# Patient Record
Sex: Male | Born: 1960
Health system: Southern US, Community
[De-identification: ages and names within clinical notes are randomized; demographics above are authoritative.]

## PROBLEM LIST (undated history)

## (undated) DIAGNOSIS — I10 Essential (primary) hypertension: Secondary | ICD-10-CM

## (undated) DIAGNOSIS — F419 Anxiety disorder, unspecified: Secondary | ICD-10-CM

## (undated) DIAGNOSIS — K829 Disease of gallbladder, unspecified: Secondary | ICD-10-CM

## (undated) HISTORY — DX: Essential (primary) hypertension: I10

## (undated) HISTORY — DX: Anxiety disorder, unspecified: F41.9

## (undated) HISTORY — DX: Disease of gallbladder, unspecified: K82.9

## (undated) HISTORY — PX: HERNIA REPAIR: SHX51

---

## 2005-12-17 ENCOUNTER — Emergency Department: Payer: Self-pay | Admitting: Emergency Medicine

## 2008-01-16 IMAGING — US ABDOMEN ULTRASOUND
1 series · 17 of 25 positions shown · non-contrast
Comparison: none

REASON FOR EXAM: Gallstones. Rm 12
COMMENTS:

[Series 1: abdomen ultrasound · 17 of 87 slices shown]
[im 1/87]
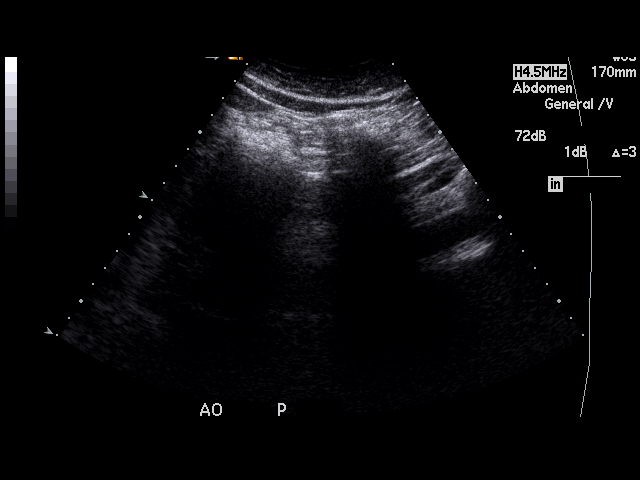
[im 8/87]
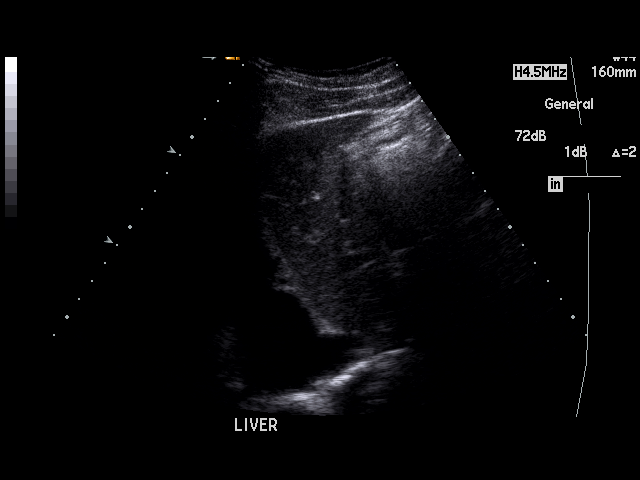
[im 11/87]
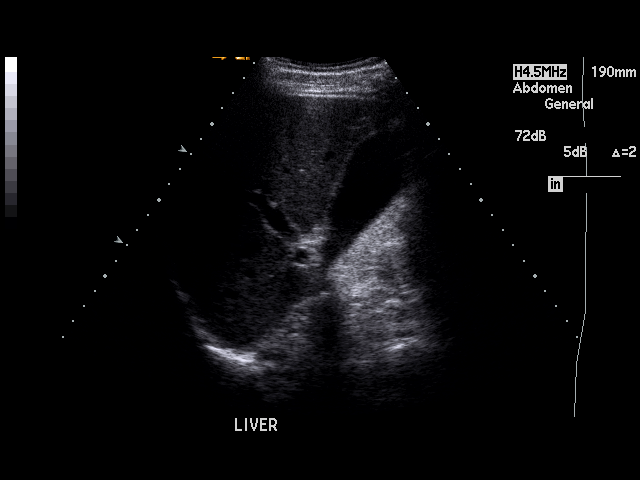
[im 18/87]
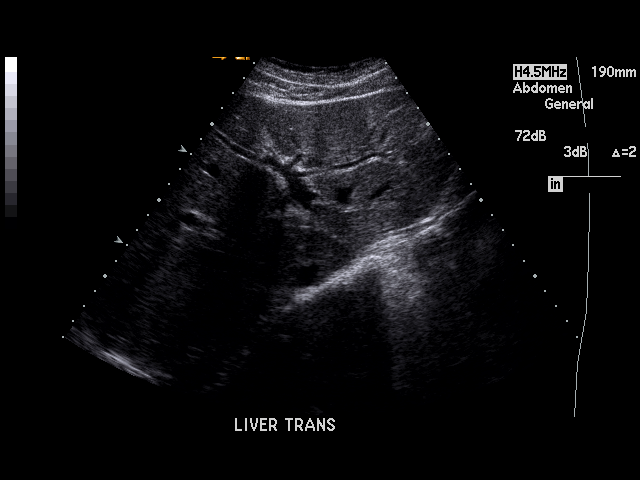
[im 22/87]
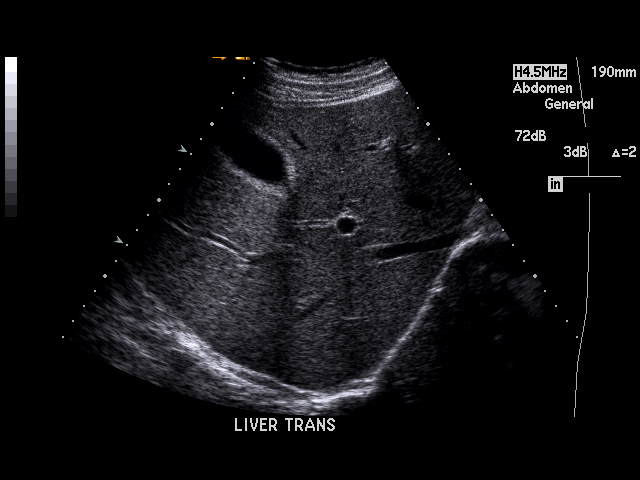
[im 29/87]
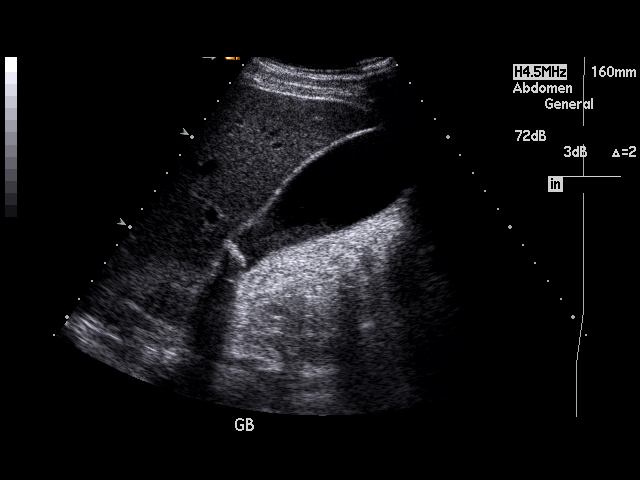
[im 33/87]
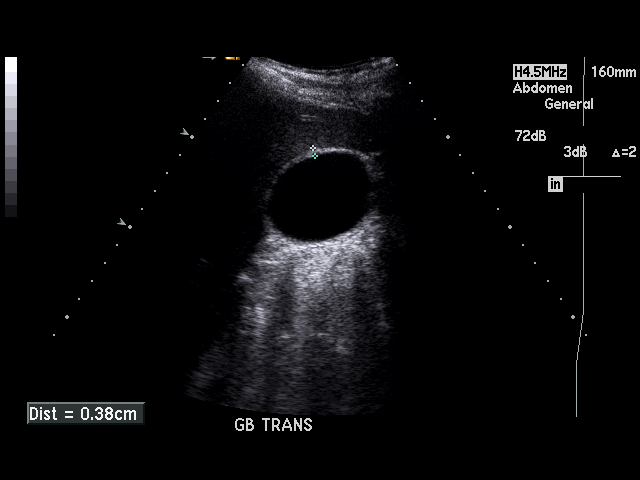
[im 40/87]
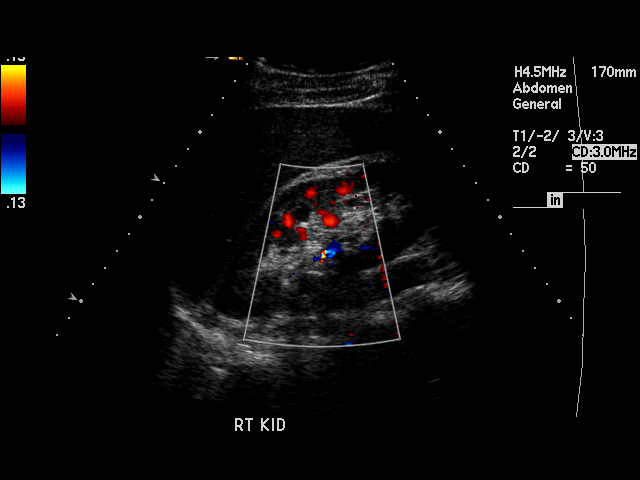
[im 44/87]
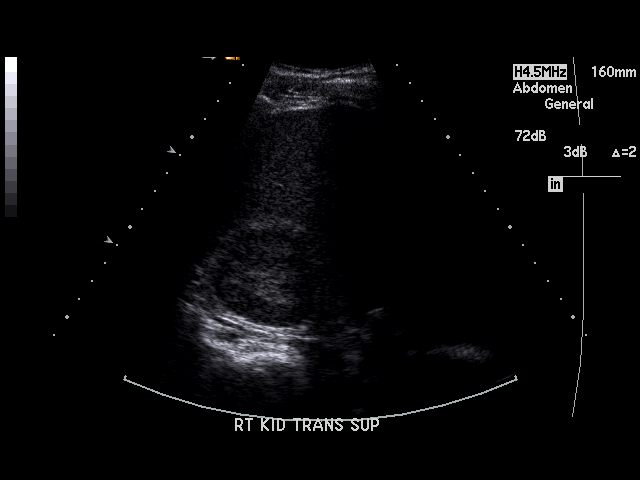
[im 47/87]
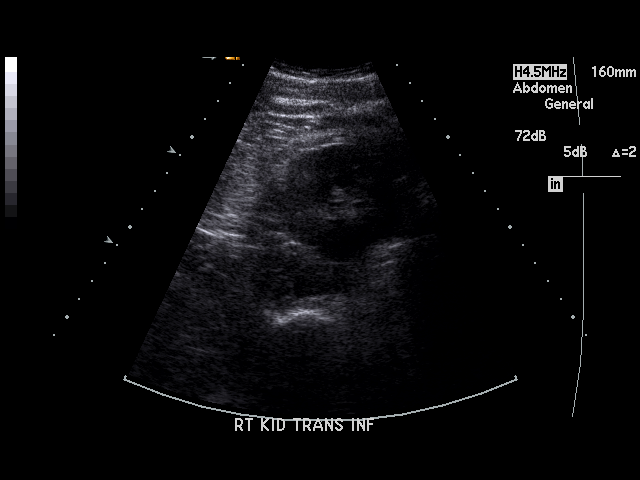
[im 54/87]
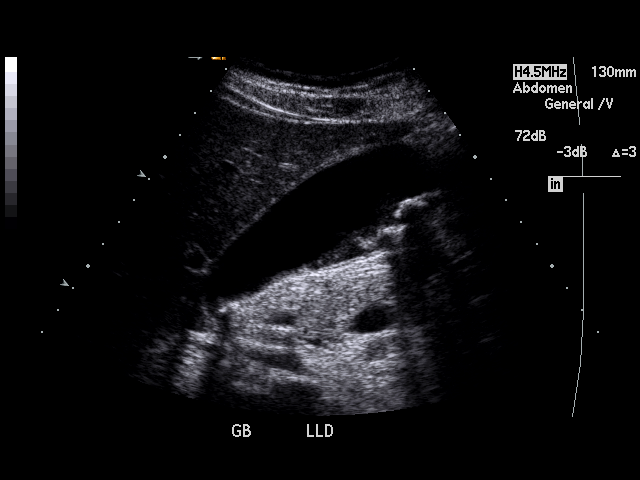
[im 58/87]
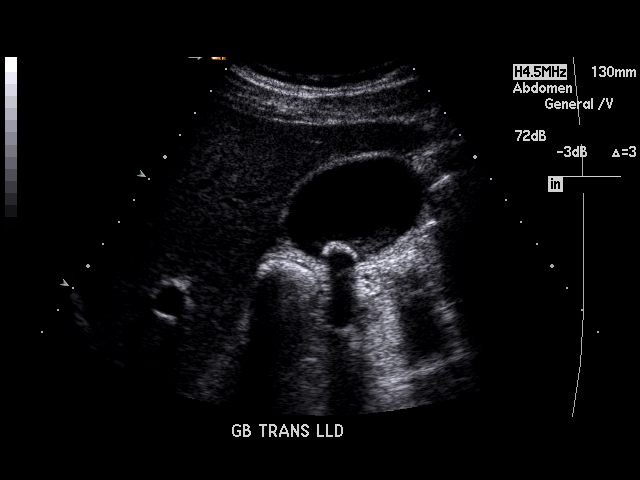
[im 65/87]
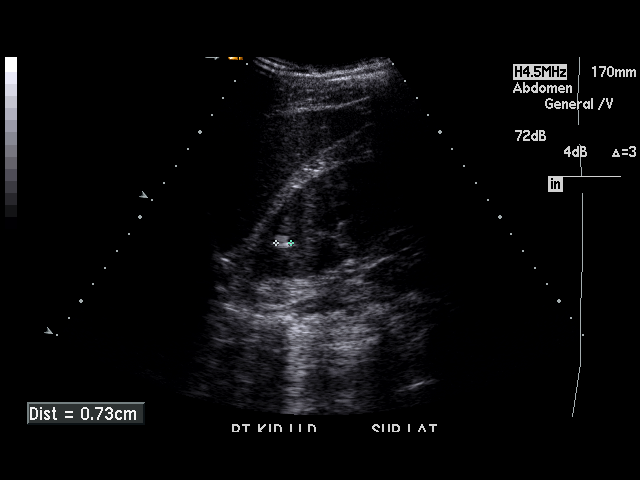
[im 69/87]
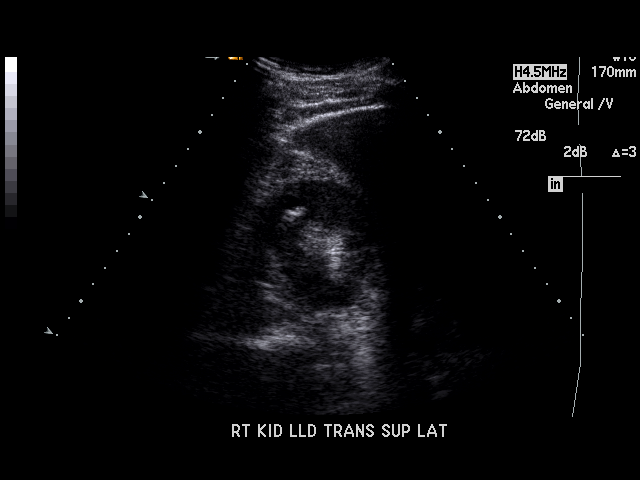
[im 76/87]
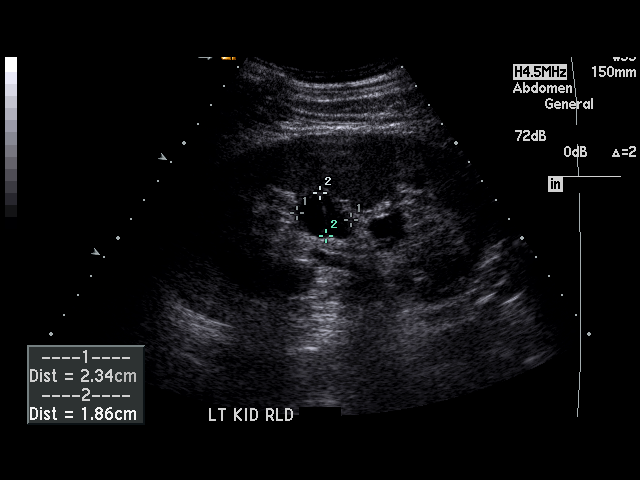
[im 79/87]
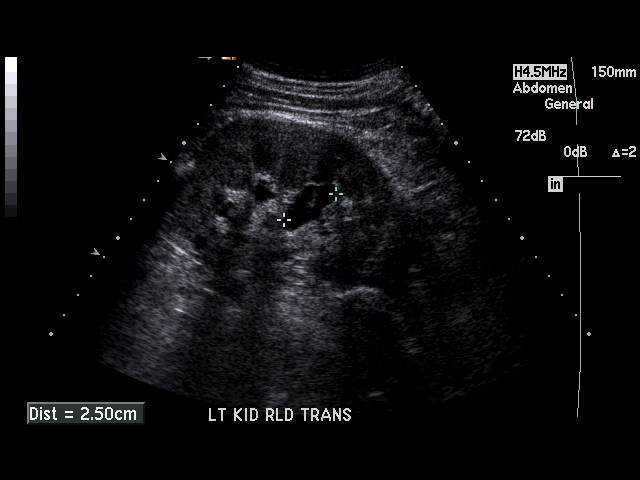
[im 87/87]
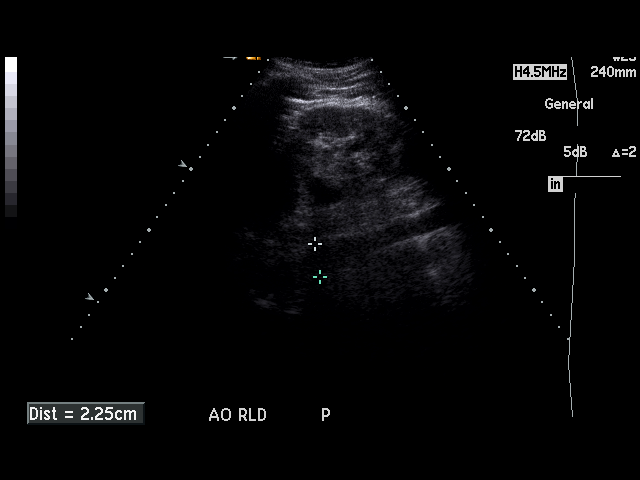

[17 of 25 positions shown; findings below may reference images not displayed]

PROCEDURE:     US  - US ABDOMEN GENERAL SURVEY  - December 17, 2005  [DATE]

RESULT:     The liver and spleen are normal in appearance.  The abdominal
aorta shows no significant abnormalities.  The pancreas is not visualized
adequately for evaluation on this exam.  There are multiple echodensities in
the gallbladder compatible with mobile gallstones.  There is also some
sludge present in the gallbladder.  The gallbladder wall is upper limits for
normal in thickness or slightly thickened.  The gallbladder wall measures
variably between 3 mm and 3.8 mm in thickness.  The common bile duct
measures 5.9 mm in diameter which is within the limits of normal.  The
kidneys show no hydronephrosis.  Multiple renal stones are noted on the
RIGHT and a single stone is observed on the LEFT. There is a 2.34 cm cyst of
the LEFT kidney.
IMPRESSION: Cholelithiasis with the gallbladder wall being upper limits for normal in
thickness or slightly thickened.

The pancreas is not visualized adequately for evaluation on this exam.

No ascites is seen.

Bilateral renal stones are noted.

There is a cyst of the LEFT kidney.

## 2011-12-31 ENCOUNTER — Emergency Department: Payer: Self-pay | Admitting: Emergency Medicine

## 2011-12-31 LAB — COMPREHENSIVE METABOLIC PANEL
Albumin: 4 g/dL (ref 3.4–5.0)
Alkaline Phosphatase: 70 U/L (ref 50–136)
BUN: 12 mg/dL (ref 7–18)
Bilirubin,Total: 0.6 mg/dL (ref 0.2–1.0)
Chloride: 104 mmol/L (ref 98–107)
Co2: 29 mmol/L (ref 21–32)
Creatinine: 1.14 mg/dL (ref 0.60–1.30)
EGFR (Non-African Amer.): >60
Glucose: 108 mg/dL — ABNORMAL HIGH (ref 65–99)
Osmolality: 282 (ref 275–301)
Potassium: 3.4 mmol/L — ABNORMAL LOW (ref 3.5–5.1)
SGOT(AST): 21 U/L (ref 15–37)
SGPT (ALT): 32 U/L (ref 12–78)
Total Protein: 7.9 g/dL (ref 6.4–8.2)

## 2012-01-01 LAB — URINALYSIS, COMPLETE
Blood: NEGATIVE
Hyaline Cast: 3
Ketone: NEGATIVE
Nitrite: NEGATIVE
Ph: 6 (ref 4.5–8.0)
Protein: 30
Specific Gravity: 1.018 (ref 1.003–1.030)
Squamous Epithelial: NONE SEEN
WBC UR: 2 /HPF (ref 0–5)

## 2012-01-01 LAB — CBC
HCT: 42.7 % (ref 40.0–52.0)
MCH: 30.3 pg (ref 26.0–34.0)
MCHC: 35.1 g/dL (ref 32.0–36.0)
MCV: 86 fL (ref 80–100)
Platelet: 242 10*3/uL (ref 150–440)
RDW: 13.3 % (ref 11.5–14.5)
WBC: 10.2 10*3/uL (ref 3.8–10.6)

## 2014-01-30 IMAGING — US ABDOMEN ULTRASOUND LIMITED
1 series · 14 of 25 positions shown · non-contrast
Comparison: none

REASON FOR EXAM: Ruq pain and nausea
COMMENTS:   Body Site: GB and Fossa, CBD, Head of Pancreas

PROCEDURE:     US  - US ABDOMEN LIMITED SURVEY  - January 01, 2012  [DATE]
RESULT:     Comparison: None
TECHNIQUE: Multiple gray-scale and color-flow Doppler images of the right
upper quadrant are presented for review.

[Series 1: abdomen ultrasound limited · 0.31mm/px · 14 of 29 slices shown]
[im 1/29]
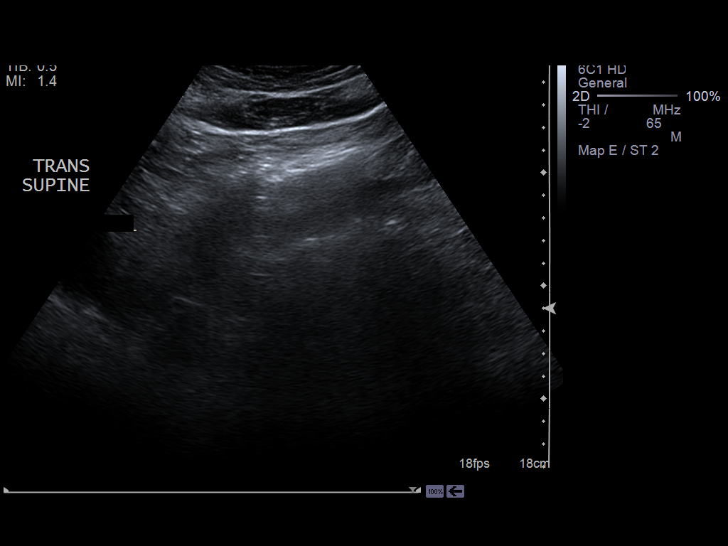
[im 3/29]
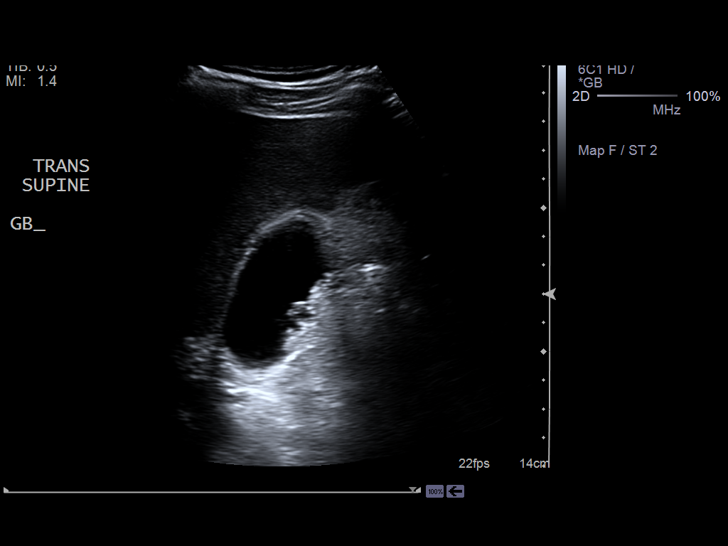
[im 5/29]
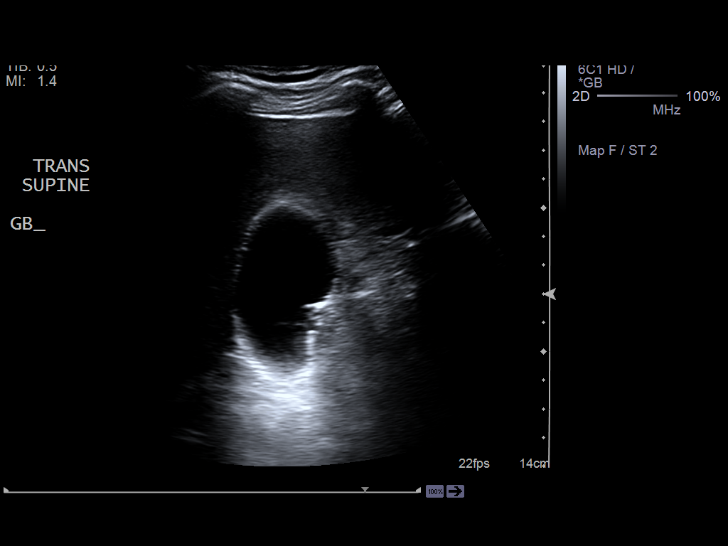
[im 8/29]
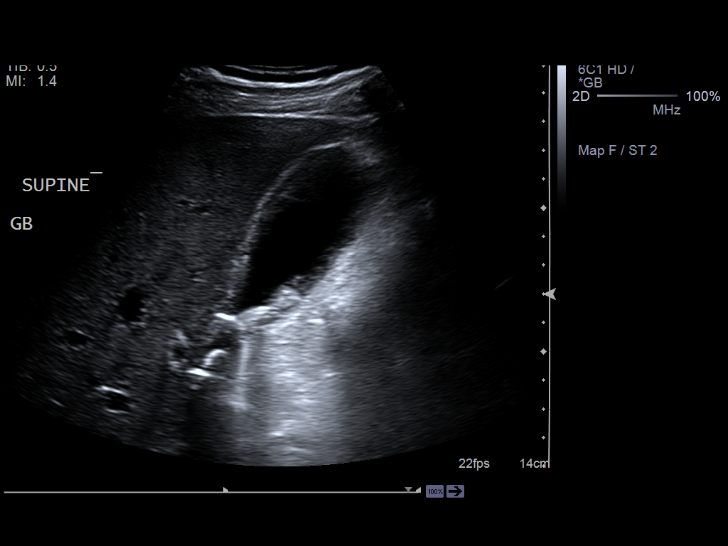
[im 10/29]
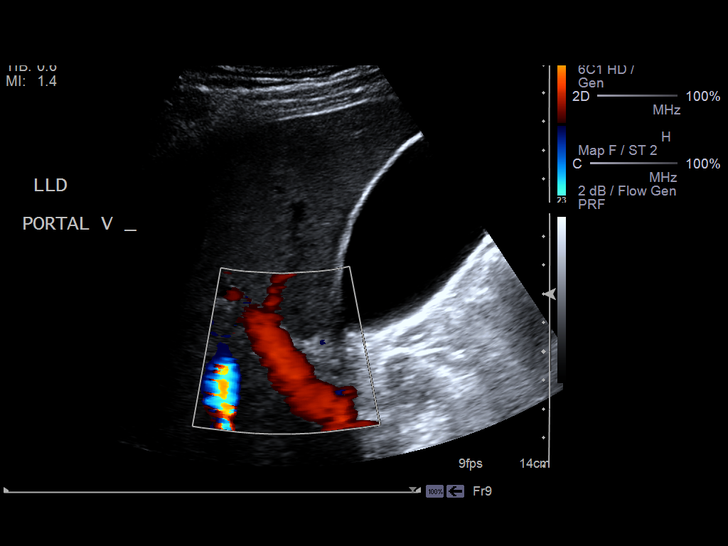
[im 11/29]
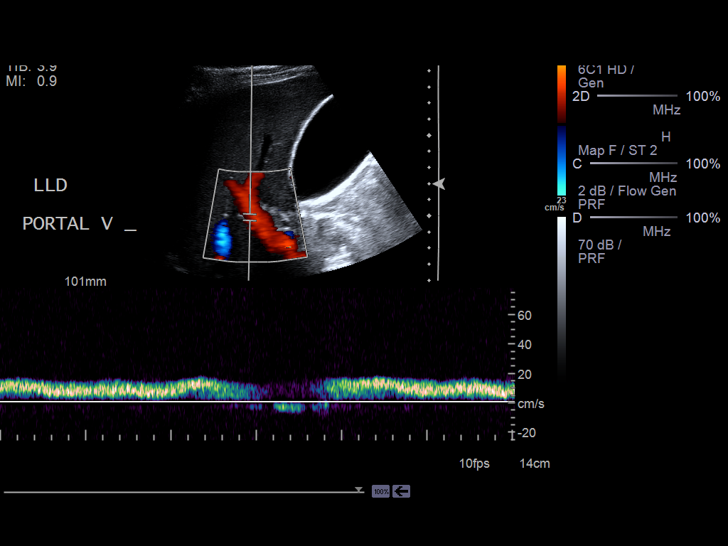
[im 13/29]
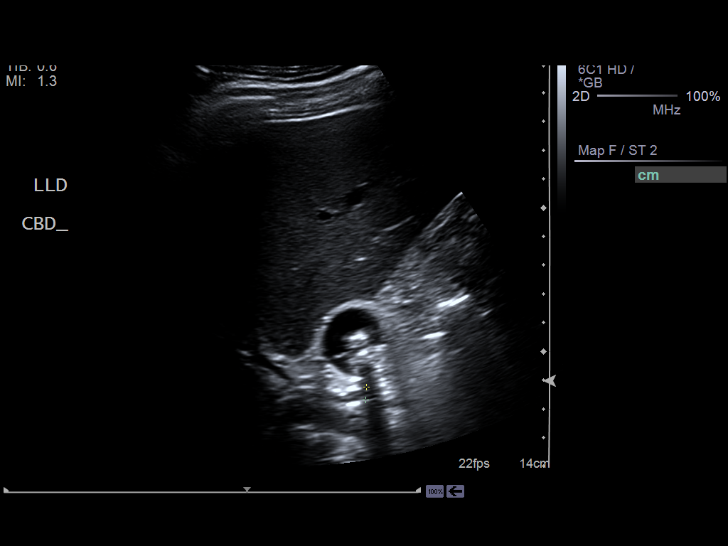
[im 16/29]
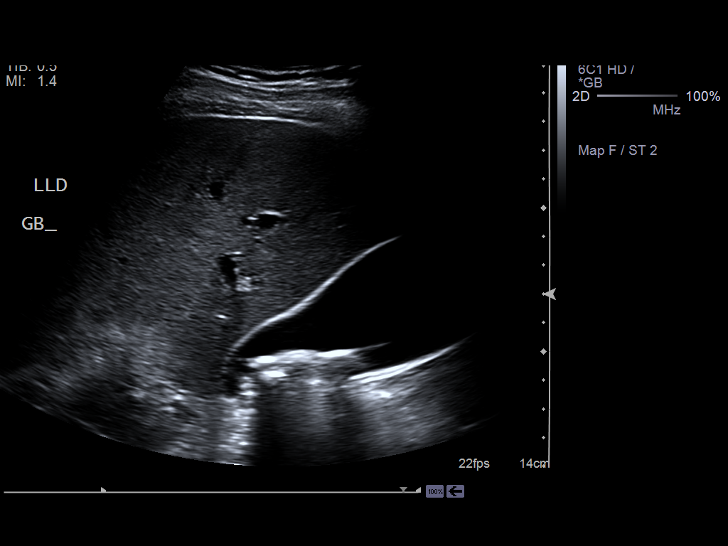
[im 18/29]
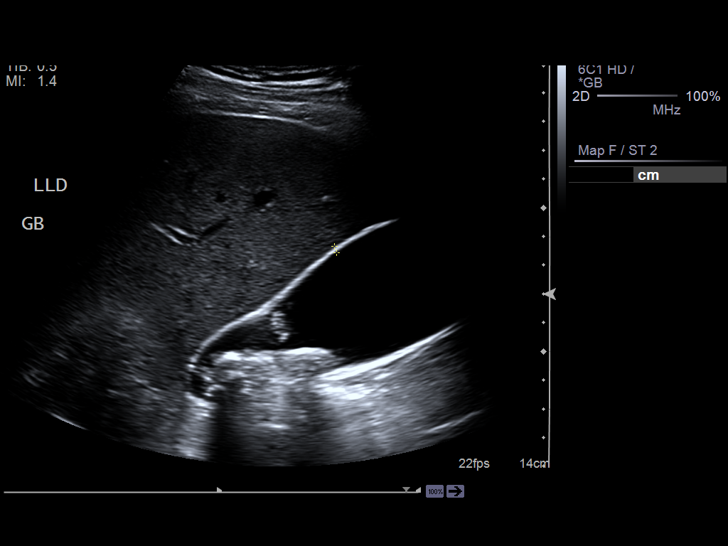
[im 19/29]
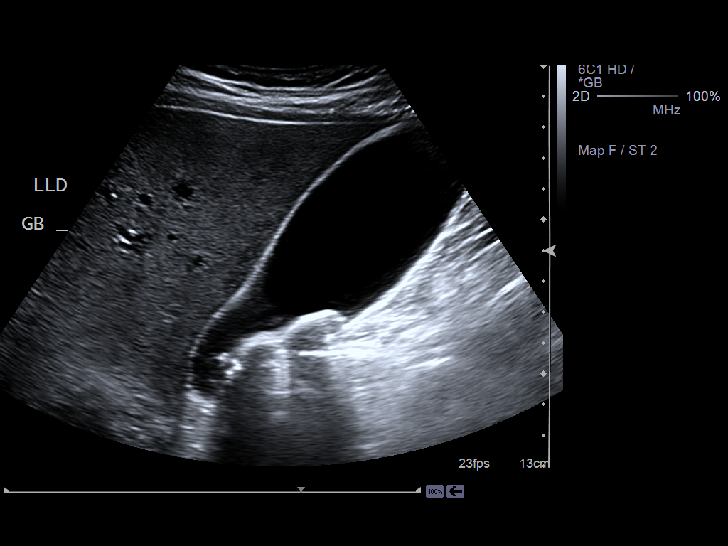
[im 22/29]
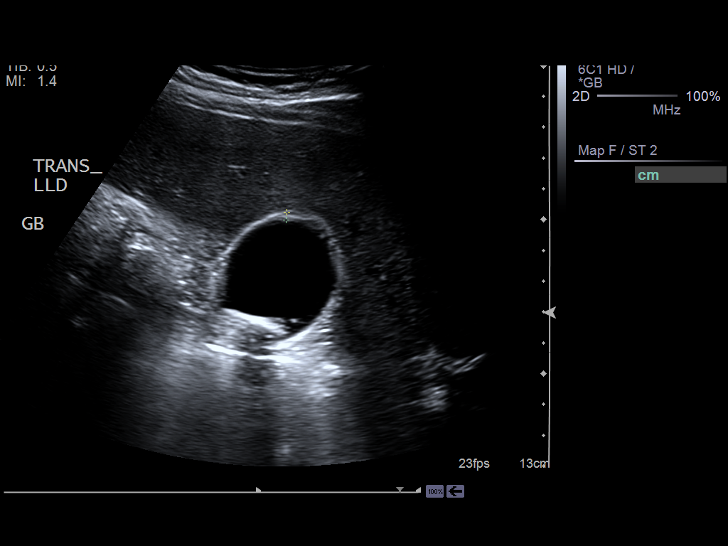
[im 24/29]
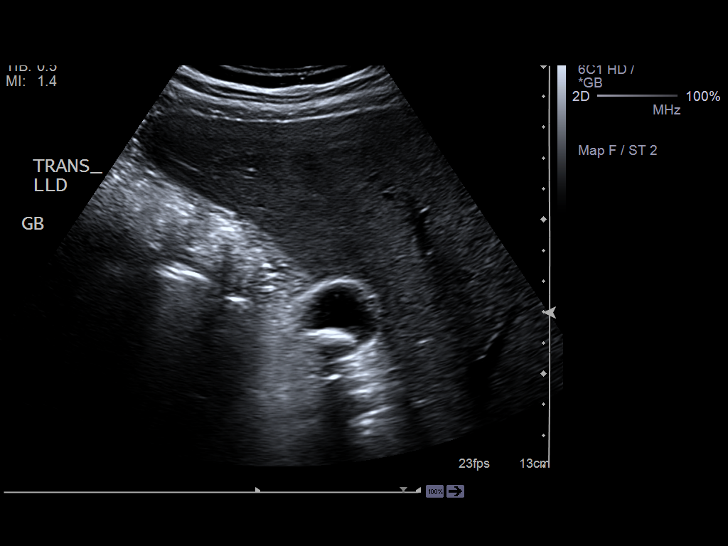
[im 26/29]
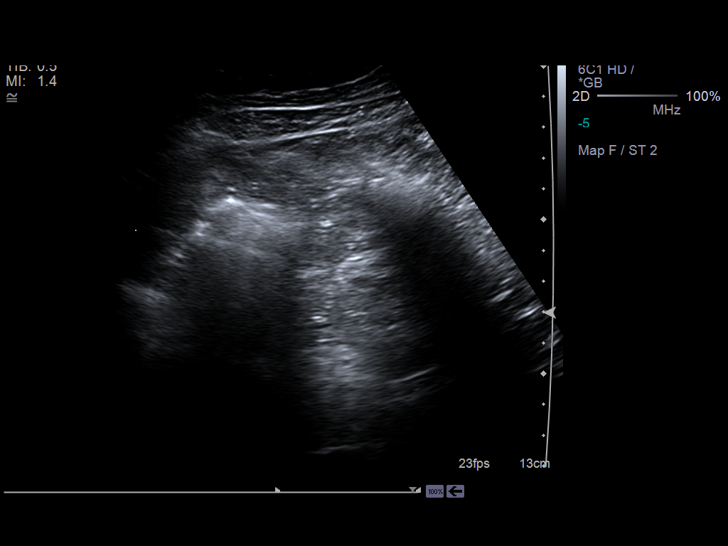
[im 29/29]
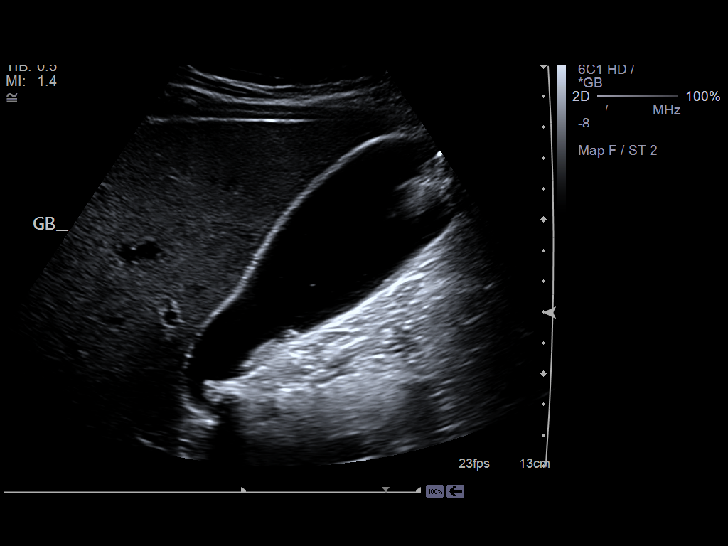

[14 of 25 positions shown; findings below may reference images not displayed]

FINDINGS: Visualized portions of the liver demonstrate normal echogenicity and normal
contours. The liver is without evidence of a focal hepatic lesion.

There are cholelithiasis. There are nonmobile gallstones in the gallbladder
neck. There is no intra- or extrahepatic biliary ductal dilatation. The
common duct measures 4.5 mm in maximal diameter. There is no gallbladder
wall thickening or pericholecystic fluid. There is a positive sonographic
Murphy's sign.

The pancreas is nonvisualized secondary to overlying bowel gas.
IMPRESSION: There are cholelithiasis with a nonmobile gallstone in the gallbladder neck.
There is a positive sonographic Murphy's sign. The findings are concerning
for, but not diagnostic of acute cholecystitis. If there is further clinical
concern recommend a HIDA scan.

[REDACTED]

## 2014-07-26 NOTE — Consult Note (Signed)
PATIENT NAME:  Richard Bentley, Richard Bentley MR#:  [id number] DATE OF BIRTH:  May 15, 1960  DATE OF CONSULTATION:  01/01/2012  REFERRING PHYSICIAN:   CONSULTING PHYSICIAN:  Cristal Deerhristopher A. Annalei Friesz, MD  REASON FOR CONSULTATION: Diarrhea x2 days, right upper quadrant pain x1 day, gallstones.   HISTORY OF PRESENT ILLNESS: Mr. Richard Bentley is a pleasant 54 year old male who is relatively healthy who has had a history of intermittent right upper quadrant pain in the last 1 to 2 years. He has had ultrasound before which shows that he has gallstones. He presents today with 2 days of diarrhea and subjective chills and 1 day of the right upper quadrant pain. He says that his pain is similar to his pain that he has had before and is sharp and located over his right upper quadrant, currently is better. No current nausea or vomiting.  No fevers, weight loss, headaches, chest pain, shortness of breath, cough. No constipation, dysuria, or hematuria.   PAST MEDICAL HISTORY: Gallstones and hypertension.   MEDICATIONS: Takes Wellbutrin daily.   ALLERGIES: No known drug allergies.   SOCIAL HISTORY: Does not smoke. Is a social drinker. Lives in [place], is here with his wife who he helps care for with cancer and chemotherapy.   PAST FAMILY HISTORY: Reports a family history of hypertension. Does not report any family history of any cancers or diabetes.   REVIEW OF SYSTEMS: A total of 12-point review of systems was obtained. Pertinent positives and negatives are presented in History of Present Illness.    PHYSICAL EXAM:   VITAL SIGNS: Temperature 98.2, pulse 68, blood pressure 153/92, respirations 16, pulse oximetry 96%.   GENERAL: No acute distress, alert, oriented x3. Talkative, pleasant.   HEAD: Normocephalic, atraumatic.   EYES: No jaundice, no scleral icterus.   FACE: Normal external nose, normal external ears.     NECK: No obvious masses.   CHEST: Lungs clear to auscultation bilaterally, moving air well.   HEART:  Regular rate and rhythm. No murmurs, rubs or gallops.   ABDOMEN: Soft, nontender, nondistended. No Murphy sign.   EXTREMITIES: Moves all extremities well. Strength 5 out of 5 all 4 extremities.   SKIN: No obvious skin lesions.   LABORATORIES: Significant for white cell count of 10.2, hemoglobin 15.0, hematocrit 42.7, platelets are 242,000. Potassium is 3.4, creatinine is 1.1. Bilirubin, alkaline phosphatase, AST and ALT are normal.   STUDIES: Ultrasound shows no pericholecystic fluid, positive sonographic Murphy's. No gallbladder wall thickening. Does have stones and apparently has a stone at the gallbladder neck.   ASSESSMENT AND PLAN: Mr. Richard Bentley is a pleasant 54 year old male who presents with recurrent right upper quadrant pain and gallstones.  I believe that he probably has symptomatic cholelithiasis. I have talked to him about removing his gallbladder. He currently has pressures with his wife who was diagnosed with cancer and to help her with her chemotherapy and would like to wait at least a couple of weeks before having his gallbladder out, which I think is appropriate. I have gotten a contact number from him at [phone]. We will schedule him for surgery at his convenience. I have given him contact information for me if he ends up having          fevers, increased pain, inability to take p.Bentley., or anything concerning for acute cholecystitis. He agrees with this plan; and if he is able to tolerate p.Bentley. and if pain is minimal, he is okay to go home from my standpoint.      ____________________________  Ladanian Kelter A. Elaynah Virginia, MD cal:vtd D: 01/01/2012 03:14:58 ET T: 01/01/2012 11:49:42 ET JOB#: [id number]  cc: Cristal Deer A. Penelope Fittro, MD, <Dictator> Jarvis Newcomer MD ELECTRONICALLY SIGNED 01/01/2012 18:40

## 2017-05-09 ENCOUNTER — Ambulatory Visit (INDEPENDENT_AMBULATORY_CARE_PROVIDER_SITE_OTHER): Payer: BLUE CROSS/BLUE SHIELD | Admitting: Unknown Physician Specialty

## 2017-05-09 ENCOUNTER — Encounter: Payer: Self-pay | Admitting: Unknown Physician Specialty

## 2017-05-09 VITALS — BP 163/103 | HR 95 | Temp 98.6°F | Ht 69.7 in | Wt 190.5 lb

## 2017-05-09 DIAGNOSIS — Z0000 Encounter for general adult medical examination without abnormal findings: Secondary | ICD-10-CM

## 2017-05-09 DIAGNOSIS — I10 Essential (primary) hypertension: Secondary | ICD-10-CM | POA: Diagnosis not present

## 2017-05-09 DIAGNOSIS — F419 Anxiety disorder, unspecified: Secondary | ICD-10-CM | POA: Diagnosis not present

## 2017-05-09 DIAGNOSIS — Z0001 Encounter for general adult medical examination with abnormal findings: Secondary | ICD-10-CM

## 2017-05-09 DIAGNOSIS — R21 Rash and other nonspecific skin eruption: Secondary | ICD-10-CM

## 2017-05-09 DIAGNOSIS — Z23 Encounter for immunization: Secondary | ICD-10-CM

## 2017-05-09 LAB — UA/M W/RFLX CULTURE, ROUTINE
Bilirubin, UA: NEGATIVE
Glucose, UA: NEGATIVE
Leukocytes, UA: NEGATIVE
NITRITE UA: NEGATIVE
Protein, UA: NEGATIVE
RBC, UA: NEGATIVE
SPEC GRAV UA: 1.02 (ref 1.005–1.030)
UUROB: 0.2 mg/dL (ref 0.2–1.0)
pH, UA: 5.5 (ref 5.0–7.5)

## 2017-05-09 LAB — MICROALBUMIN, URINE WAIVED
CREATININE, URINE WAIVED: 200 mg/dL (ref 10–300)
MICROALB, UR WAIVED: 30 mg/L — AB (ref 0–19)

## 2017-05-09 MED ORDER — AMLODIPINE BESYLATE 5 MG PO TABS: 5.0000 mg | ORAL_TABLET | Freq: Every day | ORAL | 3 refills | Status: DC

## 2017-05-09 MED ORDER — CLOTRIMAZOLE-BETAMETHASONE 1-0.05 % EX CREA: 1.0000 "application " | TOPICAL_CREAM | Freq: Two times a day (BID) | CUTANEOUS | 0 refills | Status: DC

## 2017-05-09 MED ORDER — CITALOPRAM HYDROBROMIDE 20 MG PO TABS
20.0000 mg | ORAL_TABLET | Freq: Every day | ORAL | 3 refills | Status: DC
Start: 2017-05-09 — End: 2017-09-08

## 2017-05-09 NOTE — Assessment & Plan Note (Signed)
Discussed DASH diet.  Start Amlodipine 5 mg daily

## 2017-05-09 NOTE — Patient Instructions (Signed)

## 2017-05-09 NOTE — Progress Notes (Signed)
BP (!) 163/103 (BP Location: Left Arm, Cuff Size: Normal)   Pulse 95   Temp 98.6 F (37 C) (Oral)   Ht 5' 9.7" (1.77 m)   Wt 190 lb 8 oz (86.4 kg)   SpO2 98%   BMI 27.57 kg/m    Subjective:    Patient ID: Richard Bentley, male    DOB: Nov 30, 1960, 57 y.o.   MRN: [id number]  HPI: Richard Bentley is a 57 y.o. male  Chief Complaint  Patient presents with  . Establish Care    pt states he is interested in the cologuard  . Hypertension   Gallbladder Never had it removed.  Stays away from greasy food.    Hypertension States he has been on meds in the past and feels much is related to anxiety Using medications without difficulty Average home BPs   No problems or lightheadedness No chest pain with exertion or shortness of breath No Edema  Rash New rash right inner thigh.  Itches.  Not sure precipitating factor Mole on base of penis  Anxiety Pt would like something for anxiety Depression screen [facility] 2/9 05/09/2017  Decreased Interest 0  Down, Depressed, Hopeless 0  PHQ - 2 Score 0  Altered sleeping 0  Tired, decreased energy 1  Change in appetite 0  Feeling bad or failure about yourself  0  Trouble concentrating 0  Moving slowly or fidgety/restless 0  Suicidal thoughts 0  PHQ-9 Score 1     Social History   Socioeconomic History  . Marital status: Married    Spouse name: Not on file  . Number of children: Not on file  . Years of education: Not on file  . Highest education level: Not on file  Social Needs  . Financial resource strain: Not on file  . Food insecurity - worry: Not on file  . Food insecurity - inability: Not on file  . Transportation needs - medical: Not on file  . Transportation needs - non-medical: Not on file  Occupational History  . Not on file  Tobacco Use  . Smoking status: Former Games developer  . Smokeless tobacco: Former Engineer, water and Sexual Activity  . Alcohol use: No    Frequency: Never  . Drug use: No  . Sexual activity: No    Other Topics Concern  . Not on file  Social History Narrative  . Not on file   Family History  Problem Relation Age of Onset  . Hypertension Brother    Past Medical History:  Diagnosis Date  . Anxiety   . Gallbladder attack   . Hypertension    Past Surgical History:  Procedure Laterality Date  . HERNIA REPAIR        Relevant past medical, surgical, family and social history reviewed and updated as indicated. Interim medical history since our last visit reviewed. Allergies and medications reviewed and updated.  Review of Systems  Constitutional: Negative.   HENT: Negative.   Respiratory: Negative.   Cardiovascular: Negative.   Gastrointestinal: Negative.   Genitourinary: Negative.   Skin:       New rash and new mole    Neurological: Negative.   Psychiatric/Behavioral: Negative.     Per HPI unless specifically indicated above     Objective:    BP (!) 163/103 (BP Location: Left Arm, Cuff Size: Normal)   Pulse 95   Temp 98.6 F (37 C) (Oral)   Ht 5' 9.7" (1.77 m)   Wt 190 lb 8  oz (86.4 kg)   SpO2 98%   BMI 27.57 kg/m   Wt Readings from Last 3 Encounters:  05/09/17 190 lb 8 oz (86.4 kg)    Physical Exam  Constitutional: He is oriented to person, place, and time. He appears well-developed and well-nourished.  HENT:  Head: Normocephalic.  Right Ear: Tympanic membrane, external ear and ear canal normal.  Left Ear: Tympanic membrane, external ear and ear canal normal.  Mouth/Throat: Uvula is midline, oropharynx is clear and moist and mucous membranes are normal.  Eyes: Pupils are equal, round, and reactive to light.  Cardiovascular: Normal rate, regular rhythm and normal heart sounds. Exam reveals no gallop and no friction rub.  No murmur heard. Pulmonary/Chest: Effort normal and breath sounds normal. No respiratory distress.  Abdominal: Soft. Bowel sounds are normal. He exhibits no distension. There is no tenderness.  Genitourinary: Rectum normal and  prostate normal.  Musculoskeletal: Normal range of motion.  Neurological: He is alert and oriented to person, place, and time. He has normal reflexes.  Skin: Skin is warm and dry.  Circular dry papular area right inner thigh  Psychiatric: He has a normal mood and affect. His behavior is normal. Judgment and thought content normal.   EKG NSR.  No STTW changes  No results found for this or any previous visit.    Assessment & Plan:   Problem List Items Addressed This Visit      Unprioritized   Anxiety    Start Citalpram 20 mg daily      Relevant Medications   citalopram (CELEXA) 20 MG tablet   Essential hypertension, benign    Discussed DASH diet.  Start Amlodipine 5 mg daily       Relevant Medications   amLODipine (NORVASC) 5 MG tablet   Other Relevant Orders   EKG 12-Lead (Completed)   Uric acid   Lipid Panel w/o Chol/HDL Ratio   Comprehensive metabolic panel   Microalbumin, Urine Waived    Other Visit Diagnoses    Need for influenza vaccination    -  Primary   Relevant Orders   Flu Vaccine QUAD 36+ mos IM (Completed)   Routine general medical examination at a health care facility       Relevant Orders   TSH   PSA   CBC with Differential/Platelet   UA/M w/rflx Culture, Routine   Cologuard   Rash       ? cause.  Rx for Lotrisone cream to apply BID       Follow up plan: Return in about 4 weeks (around 06/06/2017).

## 2017-05-09 NOTE — Assessment & Plan Note (Signed)
Start Citalpram 20 mg daily

## 2017-05-10 LAB — CBC WITH DIFFERENTIAL/PLATELET
BASOS ABS: 0.1 10*3/uL (ref 0.0–0.2)
BASOS: 1 %
EOS (ABSOLUTE): 0.1 10*3/uL (ref 0.0–0.4)
Eos: 1 %
Hematocrit: 42.6 % (ref 37.5–51.0)
Hemoglobin: 14.7 g/dL (ref 13.0–17.7)
Immature Grans (Abs): 0 10*3/uL (ref 0.0–0.1)
Immature Granulocytes: 1 %
Lymphocytes Absolute: 1.1 10*3/uL (ref 0.7–3.1)
Lymphs: 18 %
MCH: 30.1 pg (ref 26.6–33.0)
MCHC: 34.5 g/dL (ref 31.5–35.7)
MCV: 87 fL (ref 79–97)
MONOS ABS: 0.6 10*3/uL (ref 0.1–0.9)
Monocytes: 9 %
NEUTROS ABS: 4.5 10*3/uL (ref 1.4–7.0)
Neutrophils: 70 %
Platelets: 213 10*3/uL (ref 150–379)
RBC: 4.89 x10E6/uL (ref 4.14–5.80)
RDW: 14.1 % (ref 12.3–15.4)
WBC: 6.3 10*3/uL (ref 3.4–10.8)

## 2017-05-10 LAB — COMPREHENSIVE METABOLIC PANEL
A/G RATIO: 1.8 (ref 1.2–2.2)
ALT: 23 IU/L (ref 0–44)
AST: 22 IU/L (ref 0–40)
Albumin: 4.6 g/dL (ref 3.5–5.5)
Alkaline Phosphatase: 53 IU/L (ref 39–117)
BILIRUBIN TOTAL: 0.5 mg/dL (ref 0.0–1.2)
BUN/Creatinine Ratio: 18 (ref 9–20)
BUN: 15 mg/dL (ref 6–24)
CHLORIDE: 101 mmol/L (ref 96–106)
CO2: 22 mmol/L (ref 20–29)
Calcium: 9.5 mg/dL (ref 8.7–10.2)
Creatinine, Ser: 0.85 mg/dL (ref 0.76–1.27)
GFR, EST AFRICAN AMERICAN: 113 mL/min/{1.73_m2} (ref >59)
GFR, EST NON AFRICAN AMERICAN: 98 mL/min/{1.73_m2} (ref >59)
GLOBULIN, TOTAL: 2.5 g/dL (ref 1.5–4.5)
Glucose: 90 mg/dL (ref 65–99)
POTASSIUM: 4 mmol/L (ref 3.5–5.2)
SODIUM: 140 mmol/L (ref 134–144)
TOTAL PROTEIN: 7.1 g/dL (ref 6.0–8.5)

## 2017-05-10 LAB — LIPID PANEL W/O CHOL/HDL RATIO
CHOLESTEROL TOTAL: 168 mg/dL (ref 100–199)
HDL: 39 mg/dL — ABNORMAL LOW (ref >39)
LDL CALC: 95 mg/dL (ref 0–99)
TRIGLYCERIDES: 171 mg/dL — AB (ref 0–149)
VLDL CHOLESTEROL CAL: 34 mg/dL (ref 5–40)

## 2017-05-10 LAB — PSA: Prostate Specific Ag, Serum: 1.6 ng/mL (ref 0.0–4.0)

## 2017-05-10 LAB — URIC ACID: Uric Acid: 5.9 mg/dL (ref 3.7–8.6)

## 2017-05-10 LAB — TSH: TSH: 1.42 u[IU]/mL (ref 0.450–4.500)

## 2017-05-13 ENCOUNTER — Encounter: Payer: Self-pay | Admitting: Unknown Physician Specialty

## 2017-06-10 ENCOUNTER — Ambulatory Visit: Payer: BLUE CROSS/BLUE SHIELD | Admitting: Unknown Physician Specialty

## 2017-06-12 DIAGNOSIS — Z1211 Encounter for screening for malignant neoplasm of colon: Secondary | ICD-10-CM | POA: Diagnosis not present

## 2017-06-12 DIAGNOSIS — Z1212 Encounter for screening for malignant neoplasm of rectum: Secondary | ICD-10-CM | POA: Diagnosis not present

## 2017-06-12 LAB — COLOGUARD: Cologuard: NEGATIVE

## 2017-06-27 ENCOUNTER — Ambulatory Visit (INDEPENDENT_AMBULATORY_CARE_PROVIDER_SITE_OTHER): Payer: BLUE CROSS/BLUE SHIELD | Admitting: Unknown Physician Specialty

## 2017-06-27 ENCOUNTER — Encounter: Payer: Self-pay | Admitting: Unknown Physician Specialty

## 2017-06-27 DIAGNOSIS — I10 Essential (primary) hypertension: Secondary | ICD-10-CM

## 2017-06-27 DIAGNOSIS — F419 Anxiety disorder, unspecified: Secondary | ICD-10-CM | POA: Diagnosis not present

## 2017-06-27 MED ORDER — AMLODIPINE BESYLATE 5 MG PO TABS: 5.0000 mg | ORAL_TABLET | Freq: Every day | ORAL | 3 refills | Status: DC

## 2017-06-27 NOTE — Assessment & Plan Note (Addendum)
Some improvement.  Will try to wean to 20 mg.

## 2017-06-27 NOTE — Progress Notes (Signed)
BP (!) 147/96 (BP Location: Left Arm, Cuff Size: Normal)   Pulse 84   Temp 98.3 F (36.8 C) (Oral)   Ht 5' 9.7" (1.77 m)   Wt 184 lb (83.5 kg)   SpO2 97%   BMI 26.63 kg/m    Subjective:    Patient ID: Richard Bentley, male    DOB: [birth year], 57 y.o.   MRN: [id number]  HPI: Richard Bentley is a 57 y.o. male  Chief Complaint  Patient presents with  . Anxiety    pt states he has only been taking 10 mg of citalopram because he states that the 20 mg made him feel a little shaky  . Hypertension   Hypertension Last visit started Amlodipine 5 mg.   Average home BPs 140s/mid 80's.  Lower if in "happy place"   No problems or lightheadedness No chest pain with exertion or shortness of breath No Edema  Diet compliance:Exercise: Eating more oatmeal  Anxiety States it is a "little better" Was a little shakey with 10 mg.  Reading more.  Sleeping better with OTC sleep aid Depression screen [facility] 2/9 06/27/2017 05/09/2017  Decreased Interest 0 0  Down, Depressed, Hopeless 0 0  PHQ - 2 Score 0 0  Altered sleeping 0 0  Tired, decreased energy 0 1  Change in appetite 0 0  Feeling bad or failure about yourself  0 0  Trouble concentrating 0 0  Moving slowly or fidgety/restless 0 0  Suicidal thoughts 0 0  PHQ-9 Score 0 1    Relevant past medical, surgical, family and social history reviewed and updated as indicated. Interim medical history since our last visit reviewed. Allergies and medications reviewed and updated.  Review of Systems  Per HPI unless specifically indicated above     Objective:    BP (!) 147/96 (BP Location: Left Arm, Cuff Size: Normal)   Pulse 84   Temp 98.3 F (36.8 C) (Oral)   Ht 5' 9.7" (1.77 m)   Wt 184 lb (83.5 kg)   SpO2 97%   BMI 26.63 kg/m   Wt Readings from Last 3 Encounters:  06/27/17 184 lb (83.5 kg)  05/09/17 190 lb 8 oz (86.4 kg)    Physical Exam  Constitutional: He is oriented to person, place, and time. He appears well-developed and  well-nourished. No distress.  HENT:  Head: Normocephalic and atraumatic.  Eyes: Conjunctivae and lids are normal. Right eye exhibits no discharge. Left eye exhibits no discharge. No scleral icterus.  Neck: Normal range of motion. Neck supple. No JVD present. Carotid bruit is not present.  Cardiovascular: Normal rate, regular rhythm and normal heart sounds.  Pulmonary/Chest: Effort normal and breath sounds normal. No respiratory distress.  Abdominal: Normal appearance. There is no splenomegaly or hepatomegaly.  Musculoskeletal: Normal range of motion.  Neurological: He is alert and oriented to person, place, and time.  Skin: Skin is warm, dry and intact. No rash noted. No pallor.  Psychiatric: He has a normal mood and affect. His behavior is normal. Judgment and thought content normal.    Results for orders placed or performed in visit on 06/23/17  Cologuard  Result Value Ref Range   Cologuard Negative       Assessment & Plan:   Problem List Items Addressed This Visit      Unprioritized   Anxiety    Some improvement.  Will try to wean to 20 mg.        Essential hypertension, benign  Improved, not to goal.  Discussed diet.  Feels he can make changes.  Will recheck next visit      Relevant Medications   amLODipine (NORVASC) 5 MG tablet       Follow up plan: Return in about 1 month (around 07/25/2017).

## 2017-06-27 NOTE — Assessment & Plan Note (Addendum)
Improved, not to goal.  Discussed diet.  Feels he can make changes.  Will recheck next visit

## 2017-07-30 ENCOUNTER — Ambulatory Visit: Payer: BLUE CROSS/BLUE SHIELD | Admitting: Unknown Physician Specialty

## 2017-08-18 ENCOUNTER — Ambulatory Visit: Payer: BLUE CROSS/BLUE SHIELD | Admitting: Unknown Physician Specialty

## 2017-09-05 ENCOUNTER — Other Ambulatory Visit: Payer: Self-pay | Admitting: Unknown Physician Specialty

## 2017-09-05 NOTE — Telephone Encounter (Signed)
Copied from CRM [id number]. Topic: Quick Communication - Rx Refill/Question >> Sep 05, 2017  3:39 PM Diana Eves B wrote: Medication: citalopram (CELEXA) 20 MG tablet  Has the patient contacted their pharmacy? No. (Agent: If no, request that the patient contact the pharmacy for the refill.) (Agent: If yes, when and what did the pharmacy advise?)  Preferred Pharmacy (with phone number or street name): [id number] - [place], [place] - [address]  Agent: Please be advised that RX refills may take up to 3 business days. We ask that you follow-up with your pharmacy.

## 2017-09-08 ENCOUNTER — Ambulatory Visit: Payer: BLUE CROSS/BLUE SHIELD | Admitting: Unknown Physician Specialty

## 2017-09-08 NOTE — Telephone Encounter (Signed)
Celexa refill Last OV:06/27/17 Last refill:05/09/17 30 tab/3 refills UJW:[id number]:[facility]: [id number] [place] - [address] [facility] [address] [phone] (Phone) [phone] (Fax)

## 2017-09-09 MED ORDER — CITALOPRAM HYDROBROMIDE 20 MG PO TABS: 20.0000 mg | ORAL_TABLET | Freq: Every day | ORAL | 3 refills | Status: DC

## 2017-09-17 ENCOUNTER — Ambulatory Visit: Payer: BLUE CROSS/BLUE SHIELD | Admitting: Unknown Physician Specialty

## 2017-11-03 ENCOUNTER — Ambulatory Visit: Payer: BLUE CROSS/BLUE SHIELD | Admitting: Unknown Physician Specialty

## 2018-01-01 ENCOUNTER — Observation Stay
Admission: EM | Admit: 2018-01-01 | Discharge: 2018-01-02 | Disposition: A | Discharge status: Home / Self Care | Payer: BLUE CROSS/BLUE SHIELD | Attending: Surgery | Admitting: Surgery

## 2018-01-01 ENCOUNTER — Observation Stay: Payer: BLUE CROSS/BLUE SHIELD | Admitting: Anesthesiology

## 2018-01-01 ENCOUNTER — Other Ambulatory Visit: Payer: Self-pay

## 2018-01-01 ENCOUNTER — Encounter
Admission: EM | Disposition: A | Discharge status: Home / Self Care | Payer: Self-pay | Source: Home / Self Care | Attending: Emergency Medicine

## 2018-01-01 ENCOUNTER — Emergency Department: Payer: BLUE CROSS/BLUE SHIELD

## 2018-01-01 DIAGNOSIS — Z87891 Personal history of nicotine dependence: Secondary | ICD-10-CM | POA: Insufficient documentation

## 2018-01-01 DIAGNOSIS — Z79899 Other long term (current) drug therapy: Secondary | ICD-10-CM | POA: Insufficient documentation

## 2018-01-01 DIAGNOSIS — K8010 Calculus of gallbladder with chronic cholecystitis without obstruction: Principal | ICD-10-CM | POA: Insufficient documentation

## 2018-01-01 DIAGNOSIS — K82A1 Gangrene of gallbladder in cholecystitis: Secondary | ICD-10-CM | POA: Insufficient documentation

## 2018-01-01 DIAGNOSIS — K8020 Calculus of gallbladder without cholecystitis without obstruction: Secondary | ICD-10-CM | POA: Diagnosis not present

## 2018-01-01 DIAGNOSIS — I10 Essential (primary) hypertension: Secondary | ICD-10-CM | POA: Diagnosis not present

## 2018-01-01 DIAGNOSIS — K8050 Calculus of bile duct without cholangitis or cholecystitis without obstruction: Secondary | ICD-10-CM | POA: Diagnosis not present

## 2018-01-01 DIAGNOSIS — R1011 Right upper quadrant pain: Secondary | ICD-10-CM | POA: Diagnosis not present

## 2018-01-01 DIAGNOSIS — F419 Anxiety disorder, unspecified: Secondary | ICD-10-CM | POA: Diagnosis not present

## 2018-01-01 DIAGNOSIS — K819 Cholecystitis, unspecified: Secondary | ICD-10-CM | POA: Diagnosis not present

## 2018-01-01 HISTORY — PX: CHOLECYSTECTOMY: SHX55

## 2018-01-01 LAB — URINALYSIS, COMPLETE (UACMP) WITH MICROSCOPIC
BILIRUBIN URINE: NEGATIVE
Bacteria, UA: NONE SEEN
Glucose, UA: NEGATIVE mg/dL
HGB URINE DIPSTICK: NEGATIVE
Ketones, ur: NEGATIVE mg/dL
LEUKOCYTES UA: NEGATIVE
NITRITE: NEGATIVE
PH: 5 (ref 5.0–8.0)
Protein, ur: NEGATIVE mg/dL
SPECIFIC GRAVITY, URINE: 1.011 (ref 1.005–1.030)
Squamous Epithelial / LPF: NONE SEEN (ref 0–5)

## 2018-01-01 LAB — COMPREHENSIVE METABOLIC PANEL
ALBUMIN: 4.8 g/dL (ref 3.5–5.0)
ALT: 29 U/L (ref 0–44)
ANION GAP: 10 (ref 5–15)
AST: 22 U/L (ref 15–41)
Alkaline Phosphatase: 48 U/L (ref 38–126)
BILIRUBIN TOTAL: 1.2 mg/dL (ref 0.3–1.2)
BUN: 15 mg/dL (ref 6–20)
CO2: 28 mmol/L (ref 22–32)
Calcium: 9.6 mg/dL (ref 8.9–10.3)
Chloride: 102 mmol/L (ref 98–111)
Creatinine, Ser: 1.03 mg/dL (ref 0.61–1.24)
Glucose, Bld: 113 mg/dL — ABNORMAL HIGH (ref 70–99)
POTASSIUM: 3.8 mmol/L (ref 3.5–5.1)
Sodium: 140 mmol/L (ref 135–145)
TOTAL PROTEIN: 8.2 g/dL — AB (ref 6.5–8.1)

## 2018-01-01 LAB — CBC
HEMATOCRIT: 44 % (ref 40.0–52.0)
Hemoglobin: 15.1 g/dL (ref 13.0–18.0)
MCH: 30.1 pg (ref 26.0–34.0)
MCHC: 34.3 g/dL (ref 32.0–36.0)
MCV: 87.7 fL (ref 80.0–100.0)
Platelets: 201 10*3/uL (ref 150–440)
RBC: 5.02 MIL/uL (ref 4.40–5.90)
RDW: 13.9 % (ref 11.5–14.5)
WBC: 11.6 10*3/uL — ABNORMAL HIGH (ref 3.8–10.6)

## 2018-01-01 LAB — LIPASE, BLOOD: LIPASE: 47 U/L (ref 11–51)

## 2018-01-01 LAB — MRSA PCR SCREENING: MRSA BY PCR: NEGATIVE

## 2018-01-01 SURGERY — LAPAROSCOPIC CHOLECYSTECTOMY
Anesthesia: General | Site: Abdomen

## 2018-01-01 MED ORDER — FENTANYL CITRATE (PF) 100 MCG/2ML IJ SOLN
100.0000 ug | Freq: Once | INTRAMUSCULAR | Status: AC
  Administered 2018-01-01: 100 ug via INTRAVENOUS

## 2018-01-01 MED ORDER — ACETAMINOPHEN 10 MG/ML IV SOLN
INTRAVENOUS | Status: DC | PRN
  Administered 2018-01-01: 1000 mg via INTRAVENOUS

## 2018-01-01 MED ORDER — ROCURONIUM BROMIDE 50 MG/5ML IV SOLN
INTRAVENOUS | Status: AC
  Filled 2018-01-01: qty 1

## 2018-01-01 MED ORDER — FAMOTIDINE IN NACL 20-0.9 MG/50ML-% IV SOLN
20.0000 mg | Freq: Two times a day (BID) | INTRAVENOUS | Status: DC
  Administered 2018-01-01 – 2018-01-02 (×2): 20 mg via INTRAVENOUS
  Filled 2018-01-01 (×2): qty 50

## 2018-01-01 MED ORDER — SUGAMMADEX SODIUM 200 MG/2ML IV SOLN
INTRAVENOUS | Status: DC | PRN
  Administered 2018-01-01: 200 mg via INTRAVENOUS

## 2018-01-01 MED ORDER — BUPIVACAINE-EPINEPHRINE 0.25% -1:200000 IJ SOLN
INTRAMUSCULAR | Status: DC | PRN
  Administered 2018-01-01: 30 mL

## 2018-01-01 MED ORDER — FENTANYL CITRATE (PF) 100 MCG/2ML IJ SOLN: 25.0000 ug | INTRAMUSCULAR | Status: DC | PRN

## 2018-01-01 MED ORDER — SODIUM CHLORIDE 0.9 % IV SOLN
2.0000 g | INTRAVENOUS | Status: DC
  Administered 2018-01-01: 2 g via INTRAVENOUS
  Filled 2018-01-01: qty 20
  Filled 2018-01-01: qty 2

## 2018-01-01 MED ORDER — MORPHINE SULFATE (PF) 4 MG/ML IV SOLN
INTRAVENOUS | Status: AC
  Filled 2018-01-01: qty 1

## 2018-01-01 MED ORDER — HYDRALAZINE HCL 20 MG/ML IJ SOLN: 10.0000 mg | INTRAMUSCULAR | Status: DC | PRN

## 2018-01-01 MED ORDER — ACETAMINOPHEN 10 MG/ML IV SOLN
1000.0000 mg | Freq: Once | INTRAVENOUS | Status: AC
  Administered 2018-01-01: 1000 mg via INTRAVENOUS
  Filled 2018-01-01: qty 100

## 2018-01-01 MED ORDER — PROPOFOL 10 MG/ML IV BOLUS
INTRAVENOUS | Status: AC
  Filled 2018-01-01: qty 20

## 2018-01-01 MED ORDER — GLYCOPYRROLATE 0.2 MG/ML IJ SOLN
INTRAMUSCULAR | Status: DC | PRN
  Administered 2018-01-01: 0.2 mg via INTRAVENOUS

## 2018-01-01 MED ORDER — ONDANSETRON HCL 4 MG/2ML IJ SOLN
INTRAMUSCULAR | Status: DC | PRN
  Administered 2018-01-01: 4 mg via INTRAVENOUS

## 2018-01-01 MED ORDER — LACTATED RINGERS IV SOLN
INTRAVENOUS | Status: DC
  Administered 2018-01-01: 13:00:00 via INTRAVENOUS

## 2018-01-01 MED ORDER — KETOROLAC TROMETHAMINE 30 MG/ML IJ SOLN
INTRAMUSCULAR | Status: DC | PRN
  Administered 2018-01-01: 15 mg via INTRAVENOUS

## 2018-01-01 MED ORDER — FENTANYL CITRATE (PF) 100 MCG/2ML IJ SOLN
INTRAMUSCULAR | Status: AC
  Filled 2018-01-01: qty 2

## 2018-01-01 MED ORDER — OXYCODONE HCL 5 MG PO TABS: 5.0000 mg | ORAL_TABLET | ORAL | Status: DC | PRN

## 2018-01-01 MED ORDER — FENTANYL CITRATE (PF) 100 MCG/2ML IJ SOLN
INTRAMUSCULAR | Status: DC | PRN
  Administered 2018-01-01 (×4): 50 ug via INTRAVENOUS

## 2018-01-01 MED ORDER — ONDANSETRON HCL 4 MG/2ML IJ SOLN
4.0000 mg | Freq: Once | INTRAMUSCULAR | Status: AC
  Administered 2018-01-01: 4 mg via INTRAVENOUS

## 2018-01-01 MED ORDER — ONDANSETRON HCL 4 MG/2ML IJ SOLN
INTRAMUSCULAR | Status: AC
  Filled 2018-01-01: qty 2

## 2018-01-01 MED ORDER — FENTANYL CITRATE (PF) 100 MCG/2ML IJ SOLN: 100.0000 ug | Freq: Once | INTRAMUSCULAR | Status: DC

## 2018-01-01 MED ORDER — ONDANSETRON HCL 4 MG/2ML IJ SOLN: 4.0000 mg | Freq: Four times a day (QID) | INTRAMUSCULAR | Status: DC | PRN

## 2018-01-01 MED ORDER — DEXAMETHASONE SODIUM PHOSPHATE 10 MG/ML IJ SOLN
INTRAMUSCULAR | Status: AC
  Filled 2018-01-01: qty 1

## 2018-01-01 MED ORDER — ONDANSETRON 4 MG PO TBDP
4.0000 mg | ORAL_TABLET | Freq: Four times a day (QID) | ORAL | Status: DC | PRN
  Filled 2018-01-01: qty 1

## 2018-01-01 MED ORDER — MORPHINE SULFATE (PF) 4 MG/ML IV SOLN: 4.0000 mg | Freq: Once | INTRAVENOUS | Status: DC

## 2018-01-01 MED ORDER — HYDROMORPHONE HCL 1 MG/ML IJ SOLN
1.0000 mg | INTRAMUSCULAR | Status: DC | PRN
  Administered 2018-01-01 (×2): 1 mg via INTRAVENOUS
  Filled 2018-01-01 (×2): qty 1

## 2018-01-01 MED ORDER — SUCCINYLCHOLINE CHLORIDE 20 MG/ML IJ SOLN
INTRAMUSCULAR | Status: DC | PRN
  Administered 2018-01-01: 100 mg via INTRAVENOUS

## 2018-01-01 MED ORDER — SODIUM CHLORIDE 0.9 % IV SOLN
Freq: Once | INTRAVENOUS | Status: AC
  Administered 2018-01-01: 08:00:00 via INTRAVENOUS

## 2018-01-01 MED ORDER — FENTANYL CITRATE (PF) 100 MCG/2ML IJ SOLN
200.0000 ug | Freq: Once | INTRAMUSCULAR | Status: AC
  Administered 2018-01-01: 200 ug via INTRAVENOUS

## 2018-01-01 MED ORDER — MORPHINE SULFATE (PF) 4 MG/ML IV SOLN
4.0000 mg | Freq: Once | INTRAVENOUS | Status: AC
  Administered 2018-01-01: 4 mg via INTRAVENOUS
  Filled 2018-01-01: qty 1

## 2018-01-01 MED ORDER — HYDROMORPHONE HCL 1 MG/ML IJ SOLN
1.0000 mg | Freq: Once | INTRAMUSCULAR | Status: AC
  Administered 2018-01-01: 1 mg via INTRAVENOUS
  Filled 2018-01-01: qty 1

## 2018-01-01 MED ORDER — KETOROLAC TROMETHAMINE 30 MG/ML IJ SOLN
30.0000 mg | Freq: Four times a day (QID) | INTRAMUSCULAR | Status: DC
  Administered 2018-01-01: 30 mg via INTRAVENOUS
  Filled 2018-01-01: qty 1

## 2018-01-01 MED ORDER — ROCURONIUM BROMIDE 100 MG/10ML IV SOLN
INTRAVENOUS | Status: DC | PRN
  Administered 2018-01-01: 35 mg via INTRAVENOUS
  Administered 2018-01-01: 10 mg via INTRAVENOUS
  Administered 2018-01-01: 5 mg via INTRAVENOUS

## 2018-01-01 MED ORDER — ONDANSETRON HCL 4 MG/2ML IJ SOLN
INTRAMUSCULAR | Status: AC
  Administered 2018-01-01: 4 mg via INTRAVENOUS
  Filled 2018-01-01: qty 2

## 2018-01-01 MED ORDER — FENTANYL CITRATE (PF) 100 MCG/2ML IJ SOLN
INTRAMUSCULAR | Status: AC
  Filled 2018-01-01: qty 4

## 2018-01-01 MED ORDER — SEVOFLURANE IN SOLN
RESPIRATORY_TRACT | Status: AC
  Filled 2018-01-01: qty 250

## 2018-01-01 MED ORDER — PROCHLORPERAZINE MALEATE 10 MG PO TABS
10.0000 mg | ORAL_TABLET | Freq: Four times a day (QID) | ORAL | Status: DC | PRN
  Filled 2018-01-01: qty 1

## 2018-01-01 MED ORDER — LIDOCAINE HCL (CARDIAC) PF 100 MG/5ML IV SOSY
PREFILLED_SYRINGE | INTRAVENOUS | Status: DC | PRN
  Administered 2018-01-01: 60 mg via INTRAVENOUS

## 2018-01-01 MED ORDER — DEXAMETHASONE SODIUM PHOSPHATE 10 MG/ML IJ SOLN
INTRAMUSCULAR | Status: DC | PRN
  Administered 2018-01-01: 5 mg via INTRAVENOUS

## 2018-01-01 MED ORDER — SUCCINYLCHOLINE CHLORIDE 20 MG/ML IJ SOLN
INTRAMUSCULAR | Status: AC
  Filled 2018-01-01: qty 1

## 2018-01-01 MED ORDER — ONDANSETRON HCL 4 MG/2ML IJ SOLN
4.0000 mg | Freq: Once | INTRAMUSCULAR | Status: AC
  Administered 2018-01-01: 4 mg via INTRAVENOUS
  Filled 2018-01-01: qty 2

## 2018-01-01 MED ORDER — HEPARIN SODIUM (PORCINE) 5000 UNIT/ML IJ SOLN
5000.0000 [IU] | Freq: Three times a day (TID) | INTRAMUSCULAR | Status: DC
  Administered 2018-01-01 – 2018-01-02 (×2): 5000 [IU] via SUBCUTANEOUS
  Filled 2018-01-01 (×2): qty 1

## 2018-01-01 MED ORDER — MIDAZOLAM HCL 2 MG/2ML IJ SOLN
INTRAMUSCULAR | Status: AC
  Filled 2018-01-01: qty 2

## 2018-01-01 MED ORDER — PROMETHAZINE HCL 25 MG/ML IJ SOLN: 6.2500 mg | INTRAMUSCULAR | Status: DC | PRN

## 2018-01-01 MED ORDER — PIPERACILLIN-TAZOBACTAM 3.375 G IVPB 30 MIN
3.3750 g | Freq: Once | INTRAVENOUS | Status: AC
  Administered 2018-01-01: 3.375 g via INTRAVENOUS
  Filled 2018-01-01: qty 50

## 2018-01-01 MED ORDER — PROPOFOL 10 MG/ML IV BOLUS
INTRAVENOUS | Status: DC | PRN
  Administered 2018-01-01: 170 mg via INTRAVENOUS

## 2018-01-01 MED ORDER — PROCHLORPERAZINE EDISYLATE 10 MG/2ML IJ SOLN
5.0000 mg | Freq: Four times a day (QID) | INTRAMUSCULAR | Status: DC | PRN
  Filled 2018-01-01: qty 2

## 2018-01-01 MED ORDER — MIDAZOLAM HCL 5 MG/5ML IJ SOLN
INTRAMUSCULAR | Status: DC | PRN
  Administered 2018-01-01: 2 mg via INTRAVENOUS

## 2018-01-01 SURGICAL SUPPLY — 48 items
APPLICATOR COTTON TIP 6 STRL (MISCELLANEOUS) IMPLANT
APPLICATOR COTTON TIP 6IN STRL (MISCELLANEOUS)
APPLIER CLIP 5 13 M/L LIGAMAX5 (MISCELLANEOUS) ×3
BLADE SURG 15 STRL LF DISP TIS (BLADE) ×1 IMPLANT
BLADE SURG 15 STRL SS (BLADE) ×2
CANISTER SUCT 1200ML W/VALVE (MISCELLANEOUS) ×3 IMPLANT
CHLORAPREP W/TINT 26ML (MISCELLANEOUS) ×3 IMPLANT
CHOLANGIOGRAM CATH TAUT (CATHETERS) IMPLANT
CLEANER CAUTERY TIP 5X5 PAD (MISCELLANEOUS) ×1 IMPLANT
CLIP APPLIE 5 13 M/L LIGAMAX5 (MISCELLANEOUS) ×1 IMPLANT
DECANTER SPIKE VIAL GLASS SM (MISCELLANEOUS) ×3 IMPLANT
DERMABOND ADVANCED (GAUZE/BANDAGES/DRESSINGS) ×2
DERMABOND ADVANCED .7 DNX12 (GAUZE/BANDAGES/DRESSINGS) ×1 IMPLANT
DRAPE C-ARM XRAY 36X54 (DRAPES) IMPLANT
ELECT CAUTERY BLADE 6.4 (BLADE) ×3 IMPLANT
ELECT REM PT RETURN 9FT ADLT (ELECTROSURGICAL) ×3
ELECTRODE REM PT RTRN 9FT ADLT (ELECTROSURGICAL) ×1 IMPLANT
GLOVE BIO SURGEON STRL SZ7 (GLOVE) ×9 IMPLANT
GLOVE BIOGEL PI IND STRL 7.5 (GLOVE) ×2 IMPLANT
GLOVE BIOGEL PI INDICATOR 7.5 (GLOVE) ×4
GOWN STRL REUS W/ TWL LRG LVL3 (GOWN DISPOSABLE) ×3 IMPLANT
GOWN STRL REUS W/TWL LRG LVL3 (GOWN DISPOSABLE) ×6
IRRIGATION STRYKERFLOW (MISCELLANEOUS) ×1 IMPLANT
IRRIGATOR STRYKERFLOW (MISCELLANEOUS) ×3
IV CATH ANGIO 12GX3 LT BLUE (NEEDLE) IMPLANT
IV NS 1000ML (IV SOLUTION) ×2
IV NS 1000ML BAXH (IV SOLUTION) ×1 IMPLANT
L-HOOK LAP DISP 36CM (ELECTROSURGICAL) ×3
LHOOK LAP DISP 36CM (ELECTROSURGICAL) ×1 IMPLANT
NEEDLE HYPO 22GX1.5 SAFETY (NEEDLE) ×6 IMPLANT
NS IRRIG 1000ML POUR BTL (IV SOLUTION) ×3 IMPLANT
PACK LAP CHOLECYSTECTOMY (MISCELLANEOUS) ×3 IMPLANT
PAD CLEANER CAUTERY TIP 5X5 (MISCELLANEOUS) ×2
PENCIL ELECTRO HAND CTR (MISCELLANEOUS) ×3 IMPLANT
POUCH SPECIMEN RETRIEVAL 10MM (ENDOMECHANICALS) ×6 IMPLANT
SCISSORS METZENBAUM CVD 33 (INSTRUMENTS) IMPLANT
SLEEVE ENDOPATH XCEL 5M (ENDOMECHANICALS) ×6 IMPLANT
SOL ANTI-FOG 6CC FOG-OUT (MISCELLANEOUS) ×1 IMPLANT
SOL FOG-OUT ANTI-FOG 6CC (MISCELLANEOUS) ×2
SPONGE LAP 18X18 RF (DISPOSABLE) ×3 IMPLANT
STOPCOCK 4 WAY LG BORE MALE ST (IV SETS) IMPLANT
SUT ETHIBOND 0 MO6 C/R (SUTURE) IMPLANT
SUT MNCRL AB 4-0 PS2 18 (SUTURE) ×3 IMPLANT
SUT VICRYL 0 AB UR-6 (SUTURE) ×6 IMPLANT
SYR 20CC LL (SYRINGE) ×3 IMPLANT
TROCAR XCEL BLUNT TIP 100MML (ENDOMECHANICALS) ×3 IMPLANT
TROCAR XCEL NON-BLD 5MMX100MML (ENDOMECHANICALS) ×3 IMPLANT
TUBING INSUFFLATION (TUBING) ×3 IMPLANT

## 2018-01-01 NOTE — ED Notes (Signed)
Patient transported to Ultrasound 

## 2018-01-01 NOTE — Progress Notes (Signed)
Pt stated "he couldn't take the pain anymore!" Per conversation with surgeon give new order of IV tylenol, continue with other orders. Consent signed placed in chart. Education given to pt regarding pain management.

## 2018-01-01 NOTE — Progress Notes (Signed)
Per conversation Pt to remain NPO, Pt to have surgical procedure this day.

## 2018-01-01 NOTE — ED Triage Notes (Signed)
Pt in with co right sided abd pain since Monday, states was told he needed gallbladder removed years ago.

## 2018-01-01 NOTE — Anesthesia Post-op Follow-up Note (Signed)
Anesthesia QCDR form completed.        

## 2018-01-01 NOTE — Anesthesia Preprocedure Evaluation (Signed)
Anesthesia Evaluation  Patient identified by MRN, date of birth, ID band Patient awake    Reviewed: Allergy & Precautions, H&P , NPO status , Patient's Chart, lab work & pertinent test results, reviewed documented beta blocker date and time   History of Anesthesia Complications Negative for: history of anesthetic complications  Airway Mallampati: I  TM Distance: >3 FB Neck ROM: full    Dental  (+) Dental Advidsory Given, Caps, Teeth Intact   Pulmonary neg pulmonary ROS, former smoker,           Cardiovascular Exercise Tolerance: Good hypertension, (-) angina(-) CAD, (-) Past MI, (-) Cardiac Stents and (-) CABG (-) dysrhythmias (-) Valvular Problems/Murmurs     Neuro/Psych negative neurological ROS  negative psych ROS   GI/Hepatic negative GI ROS, Neg liver ROS,   Endo/Other  negative endocrine ROS  Renal/GU negative Renal ROS  negative genitourinary   Musculoskeletal   Abdominal   Peds  Hematology negative hematology ROS (+)   Anesthesia Other Findings Past Medical History: No date: Anxiety No date: Gallbladder attack No date: Hypertension   Reproductive/Obstetrics negative OB ROS                             Anesthesia Physical Anesthesia Plan  ASA: II  Anesthesia Plan: General   Post-op Pain Management:    Induction: Intravenous  PONV Risk Score and Plan: 2 and Ondansetron, Dexamethasone, Midazolam, Promethazine and Treatment may vary due to age or medical condition  Airway Management Planned: Oral ETT  Additional Equipment:   Intra-op Plan:   Post-operative Plan: Extubation in OR  Informed Consent: I have reviewed the patients History and Physical, chart, labs and discussed the procedure including the risks, benefits and alternatives for the proposed anesthesia with the patient or authorized representative who has indicated his/her understanding and acceptance.   Dental  Advisory Given  Plan Discussed with: Anesthesiologist, CRNA and Surgeon  Anesthesia Plan Comments:         Anesthesia Quick Evaluation

## 2018-01-01 NOTE — Op Note (Signed)
Laparoscopic Cholecystectomy  Pre-operative Diagnosis: cholecystitis  Post-operative Diagnosis: same   Surgeon: Sterling Big, MD FACS  Anesthesia: Gen. with endotracheal tube  Findings: Acute Cholecystitis   Estimated Blood Loss: 20cc                Specimens: Gallbladder           Complications: none   Procedure Details  The patient was seen again in the Holding Room. The benefits, complications, treatment options, and expected outcomes were discussed with the patient. The risks of bleeding, infection, recurrence of symptoms, failure to resolve symptoms, bile duct damage, bile duct leak, retained common bile duct stone, bowel injury, any of which could require further surgery and/or ERCP, stent, or papillotomy were reviewed with the patient. The likelihood of improving the patient's symptoms with return to their baseline status is good.  The patient and/or family concurred with the proposed plan, giving informed consent.  The patient was taken to Operating Room, identified as Richard Bentley and the procedure verified as Laparoscopic Cholecystectomy.  A Time Out was held and the above information confirmed.  Prior to the induction of general anesthesia, antibiotic prophylaxis was administered. VTE prophylaxis was in place. General endotracheal anesthesia was then administered and tolerated well. After the induction, the abdomen was prepped with Chloraprep and draped in the sterile fashion. The patient was positioned in the supine position.  Cut down technique was used to enter the abdominal cavity and a Hasson trochar was placed after two vicryl stitches were anchored to the fascia. Pneumoperitoneum was then created with CO2 and tolerated well without any adverse changes in the patient's vital signs.  Three 5-mm ports were placed in the right upper quadrant all under direct vision. All skin incisions  were infiltrated with a local anesthetic agent before making the incision and placing  the trocars.   The patient was positioned  in reverse Trendelenburg, tilted slightly to the patient's left.  The gallbladder was identified, the fundus grasped and retracted cephalad. Adhesions were lysed bluntly. The infundibulum was grasped and retracted laterally, exposing the peritoneum overlying the triangle of Calot. This was then divided and exposed in a blunt fashion. An extended critical view of the cystic duct and cystic artery was obtained.  The cystic duct was clearly identified and bluntly dissected.   Artery and duct were double clipped and divided.  The gallbladder was taken from the gallbladder fossa in a retrograde fashion with the electrocautery. The gallbladder was removed and placed in an Endocatch bag. The liver bed was irrigated and inspected. Hemostasis was achieved with the electrocautery. Copious irrigation was utilized and was repeatedly aspirated until clear.  The gallbladder and Endocatch sac were then removed through a port site.    Inspection of the right upper quadrant was performed. No bleeding, bile duct injury or leak, or bowel injury was noted. Pneumoperitoneum was released.  The periumbilical port site was closed with interrumpted 0 Vicryl sutures. 4-0 subcuticular Monocryl was used to close the skin. Dermabond was  applied.  The patient was then extubated and brought to the recovery room in stable condition. Sponge, lap, and needle counts were correct at closure and at the conclusion of the case.               Sterling Big, MD, FACS

## 2018-01-01 NOTE — ED Notes (Signed)
PT placed on 2L [place] of o2 prior to fentanyl given per MD

## 2018-01-01 NOTE — Anesthesia Procedure Notes (Signed)
Procedure Name: Intubation Date/Time: 01/01/2018 6:48 PM Performed by: Dionne Bucy, CRNA Pre-anesthesia Checklist: Patient identified, Patient being monitored, Timeout performed, Emergency Drugs available and Suction available Patient Re-evaluated:Patient Re-evaluated prior to induction Oxygen Delivery Method: Circle system utilized Preoxygenation: Pre-oxygenation with 100% oxygen Induction Type: IV induction Ventilation: Mask ventilation without difficulty Laryngoscope Size: Mac and 3 Grade View: Grade I Tube type: Oral Tube size: 7.5 mm Number of attempts: 1 Airway Equipment and Method: Stylet Placement Confirmation: ETT inserted through vocal cords under direct vision,  positive ETCO2 and breath sounds checked- equal and bilateral Secured at: 23 cm Tube secured with: Tape Dental Injury: Teeth and Oropharynx as per pre-operative assessment

## 2018-01-01 NOTE — H&P (Signed)
Patient ID: Richard Bentley, male   DOB: 09-24-1960, 57 y.o.   MRN: [id number]  HPI Richard Bentley is a 57 y.o. male sent to the emergency room with a 3-day history of right upper quadrant pain.  The pain initially was intermittent and over the last 24 hours have become constant.  The pain is sharp, severe and associated with nausea and vomiting.  No fevers no chills.  There is no specific aggravating factors and the pain has been alleviated partially by Dilaudid given in the emergency room. No evidence of biliary obstruction or cholangitis.. Did have a hernia operation as a child.  He is able to perform more than 6 METS of activity without any shortness of breath or chest pain. U/S  personally reviewed revealing evidence of gallstones with normal common bile duct and borderline wall thickening.  LFT are normal, wbc 11.6.   HPI  Past Medical History:  Diagnosis Date  . Anxiety   . Gallbladder attack   . Hypertension     Past Surgical History:  Procedure Laterality Date  . HERNIA REPAIR      Family History  Problem Relation Age of Onset  . Hypertension Brother     Social History Social History   Tobacco Use  . Smoking status: Former Games developer  . Smokeless tobacco: Former Engineer, water Use Topics  . Alcohol use: No    Frequency: Never  . Drug use: No    No Known Allergies  Current Facility-Administered Medications  Medication Dose Route Frequency Provider Last Rate Last Dose  . fentaNYL (SUBLIMAZE) injection 100 mcg  100 mcg Intravenous Once Emily Filbert, MD       Current Outpatient Medications  Medication Sig Dispense Refill  . amLODipine (NORVASC) 5 MG tablet Take 1 tablet (5 mg total) by mouth daily. 90 tablet 3  . citalopram (CELEXA) 20 MG tablet Take 1 tablet (20 mg total) by mouth daily. 30 tablet 3  . clotrimazole-betamethasone (LOTRISONE) cream Apply 1 application topically 2 (two) times daily. (Patient not taking: Reported on 06/27/2017) 30 g 0      Review of Systems Full ROS  was asked and was negative except for the information on the HPI  Physical Exam Blood pressure (!) 166/91, pulse 78, temperature 98.6 F (37 C), temperature source Oral, resp. rate 16, height 5\' 11"  (1.803 m), weight 85.7 kg, SpO2 99 %. CONSTITUTIONAL: NAD EYES: Pupils are equal, round, and reactive to light, Sclera are non-icteric. EARS, NOSE, MOUTH AND THROAT: The oropharynx is clear. The oral mucosa is pink and moist. Hearing is intact to voice. LYMPH NODES:  Lymph nodes in the neck are normal. RESPIRATORY:  Lungs are clear. There is normal respiratory effort, with equal breath sounds bilaterally, and without pathologic use of accessory muscles. CARDIOVASCULAR: Heart is regular without murmurs, gallops, or rubs. GI: The abdomen is  soft, TTP RUQ w + Murphy sign.  No peritonitis,.GU: Rectal deferred.   MUSCULOSKELETAL: Normal muscle strength and tone. No cyanosis or edema.   SKIN: Turgor is good and there are no pathologic skin lesions or ulcers. NEUROLOGIC: Motor and sensation is grossly normal. Cranial nerves are grossly intact. PSYCH:  Oriented to person, place and time. Affect is normal.  Data Reviewed  I have personally reviewed the patient's imaging, laboratory findings and medical records.    Assessment/Plan 57 year old male with classic signs and symptoms consistent with cholecystitis.  Evidence of early cholecystitis on ultrasound no evidence of biliary dilation.  Discussed with  the patient detail we will admit him start crystalloid resuscitation antibiotic therapy and perform cholecystectomy today. The risks, benefits, complications, treatment options, and expected outcomes were discussed with the patient. The possibilities of bleeding, recurrent infection, finding a normal gallbladder, perforation of viscus organs, damage to surrounding structures, bile leak, abscess formation, needing a drain placed, the need for additional procedures,  reaction to medication, pulmonary aspiration,  failure to diagnose a condition, the possible need to convert to an open procedure, and creating a complication requiring transfusion or operation were discussed with the patient. The patient and/or family concurred with the proposed plan, giving informed consent.   Sterling Big, MD FACS General Surgeon 01/01/2018, 9:10 AM

## 2018-01-01 NOTE — ED Provider Notes (Signed)
Patient was reevaluated by me, severe right upper quadrant pain persists.  He likely has biliary colic with a gallstone stuck in the neck of the gallbladder.  I will discuss with general surgery for cholecystectomy.   Emily Filbert, MD 01/01/18 [phone]

## 2018-01-01 NOTE — Transfer of Care (Signed)
Immediate Anesthesia Transfer of Care Note  Patient: Richard Bentley  Procedure(s) Performed: LAPAROSCOPIC CHOLECYSTECTOMY (N/A Abdomen)  Patient Location: PACU  Anesthesia Type:General  Level of Consciousness: sedated  Airway & Oxygen Therapy: Patient Spontanous Breathing and Patient connected to face mask oxygen  Post-op Assessment: Report given to RN and Post -op Vital signs reviewed and stable  Post vital signs: Reviewed  Last Vitals:  Vitals Value Taken Time  BP 111/63 01/01/2018  7:58 PM  Temp    Pulse 70 01/01/2018  7:58 PM  Resp 13 01/01/2018  7:58 PM  SpO2 98 % 01/01/2018  7:58 PM  Vitals shown include unvalidated device data.  Last Pain:  Vitals:   01/01/18 1605  TempSrc:   PainSc: 0-No pain         Complications: No apparent anesthesia complications

## 2018-01-01 NOTE — Progress Notes (Signed)
Urine collected prior to transport

## 2018-01-02 ENCOUNTER — Encounter: Payer: Self-pay | Admitting: Surgery

## 2018-01-02 MED ORDER — OXYCODONE HCL 5 MG PO TABS: 5.0000 mg | ORAL_TABLET | Freq: Four times a day (QID) | ORAL | 0 refills | Status: DC | PRN

## 2018-01-02 NOTE — Discharge Summary (Signed)
Discharge Summary  Patient ID: Richard Bentley MRN: [id number] DOB/AGE: Mar 10, 1961 57 y.o.  Admit date: 01/01/2018 Discharge date: 01/02/2018  Discharge Diagnoses Cholecystitis   Consultants None  Procedures Laparoscopic Cholecystectomy on 01/01/18 with Dr. Everlene Farrier  HPI: Richard Bentley is a 57 y.o. male sent to the emergency room on 09/26 with a 3-day history of right upper quadrant pain. The pain initially was intermittent and over the last 24 hours have become constant. The pain is sharp, severe and associated with nausea and vomiting. No fevers no chills. There is no specific aggravating factors and the pain has been alleviated partially by Dilaudid given in the emergency room.U/S reviewed revealing evidence of gallstones with normal common bile duct and borderline wall thickening. LFT are normal, wbc 11.6.  Hospital Course: He underwent laparoscopic cholecystectomy on 01/01/18 with Dr. Everlene Farrier. His post-operative period was unremarkable. On day of discharge (01/02/18), he was tolerating a full diet, his pain was well controlled, and he was mobilizing well. He understands his discharge instructions, restrictions, and to follow up in 2 weeks. All of his questions and concerns were addressed and answered.   Physical Exam:  Const: Well-appearing male, NAD Pulm: No respiratory distress, normal effort Card: HRRR, no m/r/g Abd: Soft, non-tender, non-distended, laparoscopic sites a CDI without erythema or drainage Pysch: A&Ox3    Allergies as of 01/02/2018   No Known Allergies     Medication List    TAKE these medications   amLODipine 5 MG tablet Commonly known as:  NORVASC Take 1 tablet (5 mg total) by mouth daily.   citalopram 20 MG tablet Commonly known as:  CELEXA Take 1 tablet (20 mg total) by mouth daily.   clotrimazole-betamethasone cream Commonly known as:  LOTRISONE Apply 1 application topically 2 (two) times daily.   oxyCODONE 5 MG immediate release  tablet Commonly known as:  Oxy IR/ROXICODONE Take 1 tablet (5 mg total) by mouth every 6 (six) hours as needed for severe pain.        Follow-up Information    P[place], [place] F, MD. Schedule an appointment as soon as possible for a visit in 2 day(s).   Specialty:  General Surgery Why:  Make an appointment in 2 weeks with Dr. Everlene Farrier....s/p lap choley 09/26 Contact information: [address] [place] [address] [phone]           Signed: Lynden Oxford , PA-C [facility]  01/02/2018, 9:59 AM [id number] M-F: 7am - 4pm

## 2018-01-02 NOTE — Discharge Planning (Signed)
Patient IV removed. RN assessment and VS revealed stability for DC to home.  Discharge papers given, explained and educated.  Discussed home self care, s/sx of infection and when to contact Dr.  Informed of suggested FU appt and appt made for 10/9 at 1115. Pain script signed and given.  Once ready, will be wheeled to front and family transporting home via car.

## 2018-01-03 LAB — HIV ANTIBODY (ROUTINE TESTING W REFLEX): HIV Screen 4th Generation wRfx: NONREACTIVE

## 2018-01-04 ENCOUNTER — Other Ambulatory Visit: Payer: Self-pay | Admitting: Unknown Physician Specialty

## 2018-01-05 NOTE — Telephone Encounter (Signed)
Patient called, left VM to return call to the office to schedule an appointment.  Citalopram refill Last refill:09/09/17 #30/3 refill Last OV:06/27/17 (noted to F/U, multiple cancelled appointments) BJY:NWGNFA Pharmacy: [id number] [place], [place] - [address] AT [facility] OF [address] & [place] [phone] (Phone) [phone] (Fax)

## 2018-01-05 NOTE — Anesthesia Postprocedure Evaluation (Signed)
Anesthesia Post Note  Patient: Richard Bentley  Procedure(s) Performed: LAPAROSCOPIC CHOLECYSTECTOMY (N/A Abdomen)  Patient location during evaluation: PACU Anesthesia Type: General Level of consciousness: awake and alert Pain management: pain level controlled Vital Signs Assessment: post-procedure vital signs reviewed and stable Respiratory status: spontaneous breathing, nonlabored ventilation, respiratory function stable and patient connected to nasal cannula oxygen Cardiovascular status: blood pressure returned to baseline and stable Postop Assessment: no apparent nausea or vomiting Anesthetic complications: no     Last Vitals:  Vitals:   01/02/18 0430 01/02/18 0828  BP: (!) 152/90 (!) 146/79  Pulse: 78 82  Resp: 19 18  Temp: 36.7 C 36.8 C  SpO2: 99% 97%    Last Pain:  Vitals:   01/02/18 1005  TempSrc:   PainSc: 0-No pain                 Lenard Simmer

## 2018-01-06 LAB — SURGICAL PATHOLOGY

## 2018-01-07 NOTE — ED Provider Notes (Signed)
[facility] Emergency Department Provider Note   First MD Initiated Contact with Patient 01/01/18 [phone]     (approximate)  I have reviewed the triage vital signs and the nursing notes.   HISTORY  Chief Complaint Abdominal Pain    HPI Richard Bentley is a 57 y.o. male with below list of chronic medical conditions including gallstones presents to the emergency department when 10 out of 10 right upper quadrant abdominal pain since Monday with associated nausea.  Patient denies any fever.  Patient denies any urinary symptoms.   Past Medical History:  Diagnosis Date  . Anxiety   . Gallbladder attack   . Hypertension     Patient Active Problem List   Diagnosis Date Noted  . Cholecystitis 01/01/2018  . Anxiety 05/09/2017  . Essential hypertension, benign 05/09/2017    Past Surgical History:  Procedure Laterality Date  . CHOLECYSTECTOMY N/A 01/01/2018   Procedure: LAPAROSCOPIC CHOLECYSTECTOMY;  Surgeon: Leafy Ro, MD;  Location: [facility] ORS;  Service: General;  Laterality: N/A;  . HERNIA REPAIR      Prior to Admission medications   Medication Sig Start Date End Date Taking? Authorizing Provider  amLODipine (NORVASC) 5 MG tablet Take 1 tablet (5 mg total) by mouth daily. 06/27/17  Yes Gabriel Cirri, NP  citalopram (CELEXA) 20 MG tablet TAKE 1 TABLET(20 MG) BY MOUTH DAILY 01/06/18   Trey Sailors, PA-C  clotrimazole-betamethasone (LOTRISONE) cream Apply 1 application topically 2 (two) times daily. Patient not taking: Reported on 06/27/2017 05/09/17   Gabriel Cirri, NP  oxyCODONE (OXY IR/ROXICODONE) 5 MG immediate release tablet Take 1 tablet (5 mg total) by mouth every 6 (six) hours as needed for severe pain. 01/02/18   Donovan Kail, PA-C    Allergies No known drug allergies  Family History  Problem Relation Age of Onset  . Hypertension Brother     Social History Social History   Tobacco Use  . Smoking status: Former Games developer  .  Smokeless tobacco: Former Engineer, water Use Topics  . Alcohol use: No    Frequency: Never  . Drug use: No    Review of Systems Constitutional: No fever/chills Eyes: No visual changes. ENT: No sore throat. Cardiovascular: Denies chest pain. Respiratory: Denies shortness of breath. Gastrointestinal: Positive right upper quadrant abdominal pain and nausea.No diarrhea.  No constipation. Genitourinary: Negative for dysuria. Musculoskeletal: Negative for neck pain.  Negative for back pain. Integumentary: Negative for rash. Neurological: Negative for headaches, focal weakness or numbness.   ____________________________________________   PHYSICAL EXAM:  VITAL SIGNS: ED Triage Vitals [01/01/18 0342]  Enc Vitals Group     BP (!) 184/111     Pulse Rate 93     Resp 20     Temp 98.6 F (37 C)     Temp Source Oral     SpO2 100 %     Weight 85.7 kg (189 lb)     Height 1.803 m (5\' 11" )     Head Circumference      Peak Flow      Pain Score 8     Pain Loc      Pain Edu?      Excl. in GC?     Constitutional: Alert and oriented.  Apparent discomfort Eyes: Conjunctivae are normal. Mouth/Throat: Mucous membranes are moist.  Oropharynx non-erythematous. Neck: No stridor.   Cardiovascular: Normal rate, regular rhythm. Good peripheral circulation. Grossly normal heart sounds. Respiratory: Normal respiratory effort.  No retractions.  Lungs CTAB. Gastrointestinal: Right upper quadrant tenderness to palpation.  No distention.  Musculoskeletal: No lower extremity tenderness nor edema. No gross deformities of extremities. Neurologic:  Normal speech and language. No gross focal neurologic deficits are appreciated.  Skin:  Skin is warm, dry and intact. No rash noted. Psychiatric: Mood and affect are normal. Speech and behavior are normal.  ____________________________________________   LABS (all labs ordered are listed, but only abnormal results are displayed)  Labs Reviewed  CBC -  Abnormal; Notable for the following components:      Result Value   WBC 11.6 (*)    All other components within normal limits  COMPREHENSIVE METABOLIC PANEL - Abnormal; Notable for the following components:   Glucose, Bld 113 (*)    Total Protein 8.2 (*)    All other components within normal limits  URINALYSIS, COMPLETE (UACMP) WITH MICROSCOPIC - Abnormal; Notable for the following components:   Color, Urine YELLOW (*)    APPearance CLEAR (*)    All other components within normal limits  MRSA PCR SCREENING  LIPASE, BLOOD  HIV ANTIBODY (ROUTINE TESTING W REFLEX)  SURGICAL PATHOLOGY     Procedures   ____________________________________________   INITIAL IMPRESSION / ASSESSMENT AND PLAN / ED COURSE  As part of my medical decision making, I reviewed the following data within the electronic MEDICAL RECORD NUMBER   57 year old male presenting with above-stated history and physical exam concerning for biliary colic with possible stone stuck in the gallbladder neck versus cholecystitis.  Ultrasound is pending at this time patient care transferred to Dr. Mayford Knife ____________________________________________  FINAL CLINICAL IMPRESSION(S) / ED DIAGNOSES  Final diagnoses:  RUQ pain  Biliary colic     MEDICATIONS GIVEN DURING THIS VISIT:  Medications  morphine 4 MG/ML injection 4 mg (4 mg Intravenous Given 01/01/18 0549)  ondansetron (ZOFRAN) injection 4 mg (4 mg Intravenous Given 01/01/18 0549)  HYDROmorphone (DILAUDID) injection 1 mg (1 mg Intravenous Given 01/01/18 0611)  fentaNYL (SUBLIMAZE) injection 100 mcg (100 mcg Intravenous Given 01/01/18 0820)  0.9 %  sodium chloride infusion ( Intravenous Stopped 01/01/18 1030)  piperacillin-tazobactam (ZOSYN) IVPB 3.375 g (0 g Intravenous Stopped 01/01/18 0921)  fentaNYL (SUBLIMAZE) injection 200 mcg (200 mcg Intravenous Given 01/01/18 0928)  ondansetron (ZOFRAN) injection 4 mg (4 mg Intravenous Given 01/01/18 0928)  acetaminophen (OFIRMEV) IV  1,000 mg (1,000 mg Intravenous New Bag/Given 01/01/18 1637)     ED Discharge Orders         Ordered    Diet - low sodium heart healthy     01/02/18 0842    Increase activity slowly     01/02/18 0842    Discharge instructions    Comments:  No lifting more than 20lbs for 6 weeks Okay to shower tomorrow, do not peel off  Glue it will fall off in 7-10 days Tylenol, Motrin, Ice for pain Follow up in 2 weeks   01/02/18 0842    Lifting restrictions    Comments:  No more than 20 lbs for 6 weeks   01/02/18 0842    Call MD for:  temperature >100.4     01/02/18 0842    Call MD for:  persistant nausea and vomiting     01/02/18 0842    Call MD for:  severe uncontrolled pain     01/02/18 0842    Call MD for:  difficulty breathing, headache or visual disturbances     01/02/18 0842    Call MD for:  redness, tenderness,  or signs of infection (pain, swelling, redness, odor or green/yellow discharge around incision site)     01/02/18 0842    oxyCODONE (OXY IR/ROXICODONE) 5 MG immediate release tablet  Every 6 hours PRN     01/02/18 0842           Note:  This document was prepared using Dragon voice recognition software and may include unintentional dictation errors.    Darci Current, MD 01/07/18 2214

## 2018-01-14 ENCOUNTER — Encounter: Payer: Self-pay | Admitting: Surgery

## 2018-01-14 ENCOUNTER — Ambulatory Visit (INDEPENDENT_AMBULATORY_CARE_PROVIDER_SITE_OTHER): Payer: BLUE CROSS/BLUE SHIELD | Admitting: Surgery

## 2018-01-14 VITALS — BP 188/98 | HR 90 | Temp 98.4°F | Ht 71.0 in | Wt 191.0 lb

## 2018-01-14 DIAGNOSIS — Z09 Encounter for follow-up examination after completed treatment for conditions other than malignant neoplasm: Secondary | ICD-10-CM

## 2018-01-14 NOTE — Progress Notes (Signed)
S/p lap chole Path d/w pt No complaints  PE NAD Abd: soft, incision c./d/i. No infection  A/p Doing well No complications RTC prn

## 2018-01-14 NOTE — Patient Instructions (Addendum)
Return as needed.The patient is aware to call back for any questions or concerns.  

## 2018-05-04 ENCOUNTER — Other Ambulatory Visit: Payer: Self-pay | Admitting: Unknown Physician Specialty

## 2018-05-04 NOTE — Telephone Encounter (Signed)
Requested medication (s) are due for refill today: Yes  Requested medication (s) are on the active medication list: Yes  Last refill:  06/27/17  Future visit scheduled: No  Notes to clinic:  Unable to refill per protocol, needs appointment     Requested Prescriptions  Pending Prescriptions Disp Refills   amLODipine (NORVASC) 5 MG tablet [Pharmacy Med Name: AMLODIPINE BESYLATE 5MG  TABLETS] 90 tablet 3    Sig: TAKE 1 TABLET(5 MG) BY MOUTH DAILY     Cardiovascular:  Calcium Channel Blockers Failed - 05/04/2018  3:19 PM      Failed - Last BP in normal range    BP Readings from Last 1 Encounters:  01/14/18 (!) 188/98         Failed - Valid encounter within last 6 months    Recent Outpatient Visits          10 months ago Essential hypertension, benign   [facility] Gabriel Cirri, NP   12 months ago Need for influenza vaccination   [facility] Gabriel Cirri, NP

## 2018-05-04 NOTE — Telephone Encounter (Signed)
Requires labs and visit for further refills.

## 2018-05-04 NOTE — Telephone Encounter (Signed)
Patient called, left VM to return call to the office to schedule an appointment with a provider. Will need physical, last CPE 05/09/17.

## 2018-05-04 NOTE — Telephone Encounter (Signed)
Called patient. No answer. Left a voicemail for pt to return call to the office.

## 2018-05-04 NOTE — Telephone Encounter (Signed)
Can we call patient and ensure follow-up is scheduled for upcoming months, as has not been seen in 10 months and needs labs for continue BP medications.  Thanks.

## 2018-05-05 NOTE — Telephone Encounter (Signed)
Left message on machine for pt to return call to the office.  

## 2018-05-05 NOTE — Telephone Encounter (Signed)
Called pt, no answer. Left VM for patient to return call to the office.

## 2018-05-06 NOTE — Telephone Encounter (Signed)
Have not been able to reach patient, will generate and send letter.

## 2024-04-20 ENCOUNTER — Emergency Department

## 2024-04-20 ENCOUNTER — Other Ambulatory Visit: Payer: Self-pay

## 2024-04-20 DIAGNOSIS — I7774 Dissection of vertebral artery: Secondary | ICD-10-CM | POA: Insufficient documentation

## 2024-04-20 DIAGNOSIS — I160 Hypertensive urgency: Secondary | ICD-10-CM | POA: Diagnosis present

## 2024-04-20 DIAGNOSIS — E876 Hypokalemia: Secondary | ICD-10-CM | POA: Diagnosis not present

## 2024-04-20 DIAGNOSIS — Z87891 Personal history of nicotine dependence: Secondary | ICD-10-CM | POA: Insufficient documentation

## 2024-04-20 DIAGNOSIS — Z79899 Other long term (current) drug therapy: Secondary | ICD-10-CM | POA: Insufficient documentation

## 2024-04-20 DIAGNOSIS — F419 Anxiety disorder, unspecified: Secondary | ICD-10-CM | POA: Diagnosis not present

## 2024-04-20 DIAGNOSIS — I10 Essential (primary) hypertension: Secondary | ICD-10-CM | POA: Diagnosis not present

## 2024-04-20 LAB — BASIC METABOLIC PANEL WITH GFR
Anion gap: 14 (ref 5–15)
BUN: 19 mg/dL (ref 8–23)
CO2: 23 mmol/L (ref 22–32)
Calcium: 10 mg/dL (ref 8.9–10.3)
Chloride: 101 mmol/L (ref 98–111)
Creatinine, Ser: 1.05 mg/dL (ref 0.61–1.24)
GFR, Estimated: >60 mL/min
Glucose, Bld: 109 mg/dL — ABNORMAL HIGH (ref 70–99)
Potassium: 3.4 mmol/L — ABNORMAL LOW (ref 3.5–5.1)
Sodium: 137 mmol/L (ref 135–145)

## 2024-04-20 LAB — MAGNESIUM: Magnesium: 2.2 mg/dL (ref 1.7–2.4)

## 2024-04-20 LAB — CBC
HCT: 45.4 % (ref 39.0–52.0)
Hemoglobin: 15.4 g/dL (ref 13.0–17.0)
MCH: 29 pg (ref 26.0–34.0)
MCHC: 33.9 g/dL (ref 30.0–36.0)
MCV: 85.5 fL (ref 80.0–100.0)
Platelets: 280 K/uL (ref 150–400)
RBC: 5.31 MIL/uL (ref 4.22–5.81)
RDW: 13.3 % (ref 11.5–15.5)
WBC: 10.1 K/uL (ref 4.0–10.5)
nRBC: 0 % (ref 0.0–0.2)

## 2024-04-20 LAB — TROPONIN T, HIGH SENSITIVITY: Troponin T High Sensitivity: 20 ng/L — ABNORMAL HIGH (ref 0–19)

## 2024-04-20 LAB — TSH: TSH: 3.62 u[IU]/mL (ref 0.350–4.500)

## 2024-04-20 MED ORDER — POTASSIUM CHLORIDE 20 MEQ PO PACK
40.0000 meq | PACK | Freq: Once | ORAL | Status: AC
  Administered 2024-04-21: 40 meq via ORAL
  Filled 2024-04-20: qty 2

## 2024-04-20 NOTE — ED Triage Notes (Signed)
 Pt reports he noticed his BP to be high aprox 2-3 days ago, pt reports he had a weird sensation in his bilateral ears that made him check his pressure. Pt reports he has a hx of HTN that he has controlled with diet and exercise. Pt is not currently on BP meds. Pt reports some palpitations as well. Denies numbness or weakness in extremities. Speech clear.

## 2024-04-21 ENCOUNTER — Observation Stay
Admit: 2024-04-21 | Discharge: 2024-04-21 | Disposition: A | Discharge status: Home / Self Care | Attending: Hospitalist | Admitting: Hospitalist

## 2024-04-21 ENCOUNTER — Observation Stay

## 2024-04-21 ENCOUNTER — Observation Stay
Admission: EM | Admit: 2024-04-21 | Discharge: 2024-04-23 | Disposition: A | Discharge status: Home / Self Care | Attending: Internal Medicine | Admitting: Internal Medicine

## 2024-04-21 ENCOUNTER — Emergency Department

## 2024-04-21 DIAGNOSIS — I7774 Dissection of vertebral artery: Secondary | ICD-10-CM

## 2024-04-21 DIAGNOSIS — I160 Hypertensive urgency: Secondary | ICD-10-CM | POA: Diagnosis not present

## 2024-04-21 DIAGNOSIS — I10 Essential (primary) hypertension: Secondary | ICD-10-CM | POA: Diagnosis not present

## 2024-04-21 DIAGNOSIS — Z7982 Long term (current) use of aspirin: Secondary | ICD-10-CM | POA: Diagnosis not present

## 2024-04-21 DIAGNOSIS — F419 Anxiety disorder, unspecified: Secondary | ICD-10-CM | POA: Diagnosis present

## 2024-04-21 DIAGNOSIS — E876 Hypokalemia: Secondary | ICD-10-CM | POA: Diagnosis present

## 2024-04-21 LAB — HEMOGLOBIN A1C
Hgb A1c MFr Bld: 5.1 % (ref 4.8–5.6)
Mean Plasma Glucose: 99.67 mg/dL

## 2024-04-21 LAB — URINALYSIS, ROUTINE W REFLEX MICROSCOPIC
Bilirubin Urine: NEGATIVE
Glucose, UA: NEGATIVE mg/dL
Hgb urine dipstick: NEGATIVE
Ketones, ur: NEGATIVE mg/dL
Leukocytes,Ua: NEGATIVE
Nitrite: NEGATIVE
Protein, ur: NEGATIVE mg/dL
Specific Gravity, Urine: 1.004 — ABNORMAL LOW (ref 1.005–1.030)
pH: 6 (ref 5.0–8.0)

## 2024-04-21 LAB — CREATININE, SERUM
Creatinine, Ser: 0.87 mg/dL (ref 0.61–1.24)
GFR, Estimated: >60 mL/min

## 2024-04-21 LAB — CBC
HCT: 47.4 % (ref 39.0–52.0)
Hemoglobin: 16.3 g/dL (ref 13.0–17.0)
MCH: 29.3 pg (ref 26.0–34.0)
MCHC: 34.4 g/dL (ref 30.0–36.0)
MCV: 85.1 fL (ref 80.0–100.0)
Platelets: 242 K/uL (ref 150–400)
RBC: 5.57 MIL/uL (ref 4.22–5.81)
RDW: 13.2 % (ref 11.5–15.5)
WBC: 7.3 K/uL (ref 4.0–10.5)
nRBC: 0 % (ref 0.0–0.2)

## 2024-04-21 LAB — HIV ANTIBODY (ROUTINE TESTING W REFLEX): HIV Screen 4th Generation wRfx: NONREACTIVE

## 2024-04-21 LAB — TROPONIN T, HIGH SENSITIVITY: Troponin T High Sensitivity: 19 ng/L (ref 0–19)

## 2024-04-21 MED ORDER — ACETAMINOPHEN 650 MG RE SUPP: 650.0000 mg | Freq: Four times a day (QID) | RECTAL | Status: DC | PRN

## 2024-04-21 MED ORDER — ENOXAPARIN SODIUM 40 MG/0.4ML IJ SOSY
40.0000 mg | PREFILLED_SYRINGE | INTRAMUSCULAR | Status: DC
  Administered 2024-04-21 – 2024-04-22 (×2): 40 mg via SUBCUTANEOUS
  Filled 2024-04-21 (×2): qty 0.4

## 2024-04-21 MED ORDER — ACETAMINOPHEN 500 MG PO TABS
1000.0000 mg | ORAL_TABLET | Freq: Once | ORAL | Status: AC
  Administered 2024-04-21: 1000 mg via ORAL
  Filled 2024-04-21: qty 2

## 2024-04-21 MED ORDER — OXYCODONE HCL 5 MG PO TABS: 5.0000 mg | ORAL_TABLET | ORAL | Status: DC | PRN

## 2024-04-21 MED ORDER — ONDANSETRON HCL 4 MG/2ML IJ SOLN: 4.0000 mg | Freq: Four times a day (QID) | INTRAMUSCULAR | Status: DC | PRN

## 2024-04-21 MED ORDER — AMLODIPINE BESYLATE 5 MG PO TABS
5.0000 mg | ORAL_TABLET | Freq: Every day | ORAL | Status: DC
  Administered 2024-04-21: 5 mg via ORAL
  Filled 2024-04-21: qty 1

## 2024-04-21 MED ORDER — ASPIRIN 81 MG PO CHEW
324.0000 mg | CHEWABLE_TABLET | Freq: Once | ORAL | Status: AC
  Administered 2024-04-21: 324 mg via ORAL
  Filled 2024-04-21: qty 4

## 2024-04-21 MED ORDER — HYDRALAZINE HCL 20 MG/ML IJ SOLN
10.0000 mg | Freq: Four times a day (QID) | INTRAMUSCULAR | Status: DC | PRN
  Administered 2024-04-21: 10 mg via INTRAVENOUS
  Filled 2024-04-21: qty 1

## 2024-04-21 MED ORDER — CITALOPRAM HYDROBROMIDE 20 MG PO TABS
20.0000 mg | ORAL_TABLET | Freq: Every day | ORAL | Status: DC
  Administered 2024-04-21 – 2024-04-23 (×3): 20 mg via ORAL
  Filled 2024-04-21 (×3): qty 1

## 2024-04-21 MED ORDER — HYDRALAZINE HCL 20 MG/ML IJ SOLN
10.0000 mg | Freq: Once | INTRAMUSCULAR | Status: AC
  Administered 2024-04-21: 10 mg via INTRAVENOUS
  Filled 2024-04-21: qty 1

## 2024-04-21 MED ORDER — AMLODIPINE BESYLATE 10 MG PO TABS
10.0000 mg | ORAL_TABLET | Freq: Every day | ORAL | Status: DC
  Administered 2024-04-22 – 2024-04-23 (×2): 10 mg via ORAL
  Filled 2024-04-21 (×2): qty 1

## 2024-04-21 MED ORDER — ASPIRIN 81 MG PO CHEW
324.0000 mg | CHEWABLE_TABLET | Freq: Every day | ORAL | Status: DC
  Administered 2024-04-21: 324 mg via ORAL
  Filled 2024-04-21: qty 4

## 2024-04-21 MED ORDER — ACETAMINOPHEN 325 MG PO TABS
650.0000 mg | ORAL_TABLET | Freq: Four times a day (QID) | ORAL | Status: DC | PRN
  Administered 2024-04-21 – 2024-04-23 (×5): 650 mg via ORAL
  Filled 2024-04-21 (×5): qty 2

## 2024-04-21 MED ORDER — IOHEXOL 350 MG/ML SOLN
75.0000 mL | Freq: Once | INTRAVENOUS | Status: AC | PRN
  Administered 2024-04-21: 75 mL via INTRAVENOUS

## 2024-04-21 MED ORDER — STROKE: EARLY STAGES OF RECOVERY BOOK: Freq: Once | Status: DC

## 2024-04-21 MED ORDER — ONDANSETRON HCL 4 MG PO TABS: 4.0000 mg | ORAL_TABLET | Freq: Four times a day (QID) | ORAL | Status: DC | PRN

## 2024-04-21 MED ORDER — ASPIRIN 81 MG PO TBEC
81.0000 mg | DELAYED_RELEASE_TABLET | Freq: Every day | ORAL | Status: DC
  Administered 2024-04-22 – 2024-04-23 (×2): 81 mg via ORAL
  Filled 2024-04-21 (×2): qty 1

## 2024-04-21 MED ORDER — AMLODIPINE BESYLATE 5 MG PO TABS
10.0000 mg | ORAL_TABLET | Freq: Once | ORAL | Status: AC
  Administered 2024-04-21: 10 mg via ORAL
  Filled 2024-04-21: qty 2

## 2024-04-21 NOTE — Evaluation (Signed)
 Occupational Therapy Evaluation Patient Details Name: Richard Bentley MRN: [id number] DOB: 12/19/1960 Today's Date: 04/21/2024   History of Present Illness   64 y.o. male with history of hypertension not on medications, anxiety who presents to the emergency department complaints of headache and feeling a pulsating pain in the back of his head and neck that started tonight.  Checked his blood pressure at home and it was significantly elevated.  States sometimes this will happen and he will feel palpitations and his blood pressure will be elevated but that will come down on its own over time.  States that it did not improve which caused him to become worried.  No vision changes, numbness, tingling, weakness.  Does feel short of breath when his blood pressure is elevated.     Clinical Impressions Pt seated on ED stretcher, he is agreeable to OT evaluation. Pt shares that he lives at home alone and is Ind in self care and mobility. He works from home. Pt checks BP at home and tries to control BP with exercise and diet. Pt does share family history of heart disease. Pt is Ind with mobility and self care tasks at this time but elevated BP with standing tasks therefore session ending for safety. Pt does not need skilled acute OT intervention at this time and pt agreeable. OT to sign off.      If plan is discharge home, recommend the following:         Functional Status Assessment   Patient has not had a recent decline in their functional status     Equipment Recommendations   None recommended by OT     Recommendations for Other Services         Precautions/Restrictions   Precautions Precautions: None     Mobility Bed Mobility Overal bed mobility: Independent                  Transfers Overall transfer level: Independent                        Balance Overall balance assessment: Independent                                          ADL either performed or assessed with clinical judgement   ADL Overall ADL's : Independent                                             Vision Baseline Vision/History: 1 Wears glasses Patient Visual Report: No change from baseline       Perception         Praxis         Pertinent Vitals/Pain Pain Assessment Pain Assessment: No/denies pain     Extremity/Trunk Assessment Upper Extremity Assessment Upper Extremity Assessment: Overall WFL for tasks assessed   Lower Extremity Assessment Lower Extremity Assessment: Overall WFL for tasks assessed   Cervical / Trunk Assessment Cervical / Trunk Assessment: Normal   Communication Communication Communication: No apparent difficulties   Cognition Arousal: Alert Behavior During Therapy: WFL for tasks assessed/performed Cognition: No apparent impairments  Following commands: Intact       Cueing  General Comments          Exercises     Shoulder Instructions      Home Living Family/patient expects to be discharged to:: Private residence Living Arrangements: Alone   Type of Home: House Home Access: Stairs to enter [employer] of Steps: 3 Entrance Stairs-Rails: Right Home Layout: Two level Alternate Level Stairs-Number of Steps: flight             Home Equipment: None          Prior Functioning/Environment Prior Level of Function : Independent/Modified Independent;Working/employed;Driving               ADLs Comments: works from home    OT Problem List:     OT Treatment/Interventions:        OT Goals(Current goals can be found in the care plan section)   Acute Rehab OT Goals Patient Stated Goal: to decrease BP   OT Frequency:       Co-evaluation              AM-PAC OT 6 Clicks Daily Activity     Outcome Measure Help from another person eating meals?: None Help from another person taking care of  personal grooming?: None Help from another person toileting, which includes using toliet, bedpan, or urinal?: None Help from another person bathing (including washing, rinsing, drying)?: None Help from another person to put on and taking off regular upper body clothing?: None Help from another person to put on and taking off regular lower body clothing?: None 6 Click Score: 24   End of Session    Activity Tolerance: Patient tolerated treatment well Patient left: in bed                   Time: 8973-8958 OT Time Calculation (min): 15 min Charges:  OT General Charges $OT Visit: 1 Visit OT Evaluation $OT Eval Low Complexity: 1 Low  Izetta Claude, MS, OTR/L , CBIS ascom [phone]  04/21/2024, 11:23 AM

## 2024-04-21 NOTE — Consult Note (Addendum)
 [facility] Consult Services  Stat Consult  Patient Name:   Richard Bentley, Richard Bentley Date of Birth:   1961/01/09 Identification Number:   MRN - [id number] Date of Service:   04/21/2024 05:09:56  Diagnosis:       R51.9 - Headache, unspecified  Impression Hypertensive urgency with tension type occipital/cervicogenic headache and palpitations, but no neurologic deficits and normal CT head, so no evidence of hypertensive emergency or acute stroke. 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 In suspected cervical artery dissection without stroke, antithrombotic therapy with either antiplatelet or anticoagulation is standard, with similar outcomes; given the very mild and uncertain lesion, single antiplatelet therapy is reasonable. Moderate proximal right M1 intracranial stenosis, likely atherosclerotic and asymptomatic in this context, without acute ischemia; this should be managed with aggressive vascular risk factor control and antiplatelet therapy, not acute endovascular intervention.  Recommendations: Blood pressure No hypertensive emergency signs (no focal neurologic deficit, chest pain, pulmonary edema, AKI, or retinal findings reported); treat as hypertensive urgency. Gradual BP reduction in ED with oral agents as needed, aiming over hours to bring systolic into the 140-160 range, avoiding rapid large drops to prevent cerebral hypoperfusion, especially with intracranial stenosis. Outpatient goal: <130/80 if tolerated, with close primary care/cardiology follow up and home BP monitoring. Antithrombotic therapy Continue aspirin , but reduce to 81 mg daily for chronic secondary prevention rather than 325 mg long term  to reduce bleeding risk, given no acute infarct and only possible minor dissection. No indication for dual antiplatelet therapy or anticoagulation at this time: there is no acute stroke or TIA, the vertebral lesion is subtle and possibly artifactual, and right M1 stenosis is moderate and asymptomatic. Imaging/follow up Recommend outpatient vascular follow up (stroke neurology or vascular neurology) to: Re review CTA and decide whether to obtain repeat CTA or MRA of the neck in ~2-3 months to confirm or exclude vertebral dissection and to reassess the right M1 stenosis. No emergent MRI is required tonight given normal CT, normal exam, and absence of focal posterior circulation symptoms. Other issues Evaluate the right upper back subcutaneous density clinically; if clinically suspicious, plan outpatient ultrasound or dermatology/surgical evaluation, otherwise can be observed. Address modifiable risk: optimize hypertension management, encourage stress reduction, breaks from prolonged neck posture, and consider sleep apnea screening if clinically suggested.   ----------------------------------------------------------------------------------------------------   Advanced Imaging: CTA Head and Neck Completed.  LVO:No  Patient is not a candidate for NIR    Metrics: Callback Response Time: 04/21/2024 05:12:20  Primary Provider Notified of Diagnostic Impression and Management Plan on: 04/21/2024 05:46:54   CT HEAD: I personally reviewed all the CT images that were available to me and it showed: No acute abnormalities   Imaging Reviewed  Labs Reviewed   ----------------------------------------------------------------------------------------------------  Chief Complaint: Headache, elevated blood pressure  History of Present Illness: 64 year old man with long standing hypertension presents after measuring home BP in the 240s/130s and noting his heart rate in the 80s with a sensation  of skipped beats. He reports several days of rising BP and a new headache at the base of his neck that feels like a tension headache; he denies trauma, neck strain, or falls, but spends long hours at a desk and occasionally rides a motorcycle with his head tilted back  for prolonged periods. He takes aspirin  325 mg. In the ED, BP is 197/99 and pulse 73; CBC and CMP are normal. CT head is normal.  CTA head and neck shows: (1) a short segment linear filling defect in the distal right V2 segment at C3, possibly artifact but a small dissection flap cannot be excluded, without intimal irregularity or stenosis; (2) focal moderate stenosis of the proximal right M1; (3) mild age appropriate atheromatous change at carotid bifurcations and siphons without hemodynamically significant stenosis; and (4) an irregular subcutaneous soft tissue density in the right upper back, likely post traumatic or inflammatory.    Past Medical History:      Hypertension  Medications:  No Anticoagulant use  No Antiplatelet use Reviewed EMR for current medications  Allergies:  Reviewed  Social History: Smoking: Former Alcohol Use: No Drug Use: No  Family History:  There is no family history of premature cerebrovascular disease pertinent to this consultation  ROS : 14 Points Review of Systems was performed and was negative except mentioned in HPI.  Past Surgical History: There Is No Surgical History Contributory To Todays Visit    Examination: BP(197/99), Pulse(73), 1A: Level of Consciousness - Alert; keenly responsive + 0 1B: Ask Month and Age - Both Questions Right + 0 1C: Blink Eyes & Squeeze Hands - Performs Both Tasks + 0 2: Test Horizontal Extraocular Movements - Normal + 0 3: Test Visual Fields - No Visual Loss + 0 4: Test Facial Palsy (Use Grimace if Obtunded) - Normal symmetry + 0 5A: Test Left Arm Motor Drift - No Drift for 10 Seconds + 0 5B: Test Right Arm Motor Drift - No Drift for 10 Seconds  + 0 6A: Test Left Leg Motor Drift - No Drift for 5 Seconds + 0 6B: Test Right Leg Motor Drift - No Drift for 5 Seconds + 0 7: Test Limb Ataxia (FNF/Heel-Shin) - No Ataxia + 0 8: Test Sensation - Normal; No sensory loss + 0 9: Test Language/Aphasia - Normal; No aphasia + 0 10: Test Dysarthria - Normal + 0 11: Test Extinction/Inattention - No abnormality + 0  NIHSS Score: 0  Spoke with : Dr. Neomi    This consult was conducted in real time using interactive audio and immunologist. Patient was informed of the technology being used for this visit and agreed to proceed. Patient located in hospital and provider located at home/office setting.   Patient is being evaluated for possible acute neurologic impairment and high probability of imminent or life - threatening deterioration.I spent total of 35 minutes providing care to this patient, including time for face to face visit via telemedicine, review of medical records, imaging studies and discussion of findings with providers, the patient and / or family.   Dr Sheena Cairo     [facility] For Inpatient follow-up with [facility] physician please call [facility] at [phone]. As we are not an outpatient service for any post hospital discharge needs please contact the hospital for assistance.  If you have any questions for the [facility] physicians or need to reconsult for clinical or diagnostic changes please contact us  via [facility] at [phone].  Non-radiologist review of imaging performed to assist with emergent clinical decision-making. Remote physician workstations do not possess the same resolution, calibration, or diagnostic capabilities as hospital-based radiology reading stations, and formal radiologist read is necessary.   Signature : Sheena Cairo

## 2024-04-21 NOTE — Progress Notes (Signed)
*  PRELIMINARY RESULTS* Echocardiogram 2D Echocardiogram has been performed.  Richard Bentley 04/21/2024, 1:37 PM

## 2024-04-21 NOTE — Progress Notes (Signed)
 PT Cancellation Note  Patient Details Name: Richard Bentley MRN: [id number] DOB: 1960-06-05   Cancelled Treatment:    Reason Eval/Treat Not Completed: PT screened, no needs identified, will sign off Pt reports at baseline level of mobility, no acute PT needs at this time. PT to complete current orders, please re-consult if new needs arise.  [name], PT, DPT 04/21/2024, 10:45 AM   [name] 04/21/2024, 10:45 AM

## 2024-04-21 NOTE — ED Provider Notes (Addendum)
 "  [facility] Provider Note    Event Date/Time   First MD Initiated Contact with Patient 04/21/24 0117     (approximate)   History   Hypertension   HPI  Richard Bentley is a 64 y.o. male with history of hypertension not on medications, anxiety who presents to the emergency department complaints of headache and feeling a pulsating pain in the back of his head and neck that started tonight.  Checked his blood pressure at home and it was significantly elevated.  States sometimes this will happen and he will feel palpitations and his blood pressure will be elevated but that will come down on its own over time.  States that it did not improve which caused him to become worried.  No vision changes, numbness, tingling, weakness.  Does feel short of breath when his blood pressure is elevated.  States he did have some chest discomfort that he describes as a tightness but that is gone.  Only feels irregular heartbeats now.  No pain in the abdomen or pain in the back.  No ripping, tearing pain.   History provided by patient.    Past Medical History:  Diagnosis Date   Anxiety    Gallbladder attack    Hypertension     Past Surgical History:  Procedure Laterality Date   CHOLECYSTECTOMY N/A 01/01/2018   Procedure: LAPAROSCOPIC CHOLECYSTECTOMY;  Surgeon: Jordis Laneta FALCON, MD;  Location: [facility] ORS;  Service: General;  Laterality: N/A;   HERNIA REPAIR      MEDICATIONS:  Prior to Admission medications  Medication Sig Start Date End Date Taking? Authorizing Provider  amLODipine  (NORVASC ) 5 MG tablet TAKE 1 TABLET(5 MG) BY MOUTH DAILY 05/04/18   Cannady, Jolene T, NP  citalopram  (CELEXA ) 20 MG tablet TAKE 1 TABLET(20 MG) BY MOUTH DAILY 01/06/18   Ashok Kathrine HERO, PA-C    Physical Exam   Triage Vital Signs: ED Triage Vitals  Encounter Vitals Group     BP 04/20/24 2159 (!) 226/126     Girls Systolic BP Percentile --      Girls Diastolic BP Percentile --      Boys  Systolic BP Percentile --      Boys Diastolic BP Percentile --      Pulse Rate 04/20/24 2159 (!) 102     Resp 04/20/24 2159 18     Temp 04/20/24 2159 (!) 97.5 F (36.4 C)     Temp src --      SpO2 04/20/24 2159 98 %     Weight 04/20/24 2157 189 lb (85.7 kg)     Height 04/20/24 2157 6' (1.829 m)     Head Circumference --      Peak Flow --      Pain Score 04/20/24 2157 0     Pain Loc --      Pain Education --      Exclude from Growth Chart --     Most recent vital signs: Vitals:   04/21/24 0437 04/21/24 0512  BP:  (!) 197/99  Pulse: 72 73  Resp: 20 19  Temp:    SpO2: 96% 94%    CONSTITUTIONAL: Alert, responds appropriately to questions.  Appears anxious but not in distress HEAD: Normocephalic, atraumatic EYES: Conjunctivae clear, pupils appear equal, sclera nonicteric ENT: normal nose; moist mucous membranes NECK: Supple, normal ROM CARD: Regular and tachycardic; S1 and S2 appreciated RESP: Normal chest excursion without splinting or tachypnea; breath sounds clear and equal bilaterally;  no wheezes, no rhonchi, no rales, no hypoxia or respiratory distress, speaking full sentences ABD/GI: Non-distended; soft, non-tender, no rebound, no guarding, no peritoneal signs BACK: The back appears normal EXT: Normal ROM in all joints; no deformity noted, no edema, no calf tenderness or calf swelling SKIN: Normal color for age and race; warm; no rash on exposed skin NEURO: Moves all extremities equally, normal speech, normal sensation, no facial asymmetry PSYCH: The patient's mood and manner are appropriate.   ED Results / Procedures / Treatments   LABS: (all labs ordered are listed, but only abnormal results are displayed) Labs Reviewed  BASIC METABOLIC PANEL WITH GFR - Abnormal; Notable for the following components:      Result Value   Potassium 3.4 (*)    Glucose, Bld 109 (*)    All other components within normal limits  URINALYSIS, ROUTINE W REFLEX MICROSCOPIC - Abnormal;  Notable for the following components:   Color, Urine STRAW (*)    APPearance CLEAR (*)    Specific Gravity, Urine 1.004 (*)    All other components within normal limits  TROPONIN T, HIGH SENSITIVITY - Abnormal; Notable for the following components:   Troponin T High Sensitivity 20 (*)    All other components within normal limits  CBC  MAGNESIUM  TSH  TROPONIN T, HIGH SENSITIVITY     EKG:  EKG Interpretation Date/Time:  Tuesday April 20 2024 22:01:09 EST Ventricular Rate:  96 PR Interval:  176 QRS Duration:  90 QT Interval:  332 QTC Calculation: 419 R Axis:   4  Text Interpretation: Sinus rhythm with frequent Premature ventricular complexes Inferior infarct , age undetermined Anterior infarct , age undetermined Abnormal ECG No previous ECGs available Confirmed by Neomi Neptune [phone]) on 04/21/2024 1:53:51 AM         RADIOLOGY: My personal review and interpretation of imaging: CT scan concerning for vertebral artery dissection.  I have personally reviewed all radiology reports.   CT ANGIO HEAD NECK W WO CM Result Date: 04/21/2024 CLINICAL DATA:  Initial evaluation for acute headache, neck pain. EXAM: CT ANGIOGRAPHY HEAD AND NECK WITH AND WITHOUT CONTRAST TECHNIQUE: Multidetector CT imaging of the head and neck was performed using the standard protocol during bolus administration of intravenous contrast. Multiplanar CT image reconstructions and MIPs were obtained to evaluate the vascular anatomy. Carotid stenosis measurements (when applicable) are obtained utilizing NASCET criteria, using the distal internal carotid diameter as the denominator. RADIATION DOSE REDUCTION: This exam was performed according to the departmental dose-optimization program which includes automated exposure control, adjustment of the mA and/or kV according to patient size and/or use of iterative reconstruction technique. CONTRAST:  75mL OMNIPAQUE  IOHEXOL  350 MG/ML SOLN COMPARISON:  None available.  FINDINGS: CT HEAD FINDINGS Brain: Cerebral volume within normal limits for patient age. No acute intracranial hemorrhage. No acute large vessel territory infarct. No mass lesion, midline shift, or mass effect. Ventricles are normal in size without hydrocephalus. No extra-axial fluid collection. Vascular: No abnormal hyperdense vessel. Skull: Scalp soft tissues demonstrate no acute abnormality. Calvarium intact. Sinuses/Orbits: Globes and orbital soft tissues within normal limits. Visualized paranasal sinuses are largely clear. No significant mastoid effusion. CTA NECK FINDINGS Aortic arch: Aortic arch within normal limits for caliber with standard branch pattern. No stenosis about the origin the great vessels. Right carotid system: Right common and internal carotid arteries are patent without dissection. Minimal for age plaque about the right carotid bulb without stenosis. Left carotid system: Left common and internal carotid arteries are  patent without dissection. Mild for age atheromatous change about the left CCA and left carotid bulb without hemodynamically significant stenosis. Vertebral arteries: Both vertebral arteries arise from the subclavian arteries. No proximal subclavian artery stenosis. Right vertebral artery dominant. Left vertebral artery patent without stenosis or dissection. On the right, there is a small apparent linear filling defect involving the distal right V2 segment at the level of the C3 transverse foramen (series 8, images 103, 102). While this finding could be artifactual nature as there is some streak artifact through this region, a possible small short-segment dissection flap is difficult to exclude. No significant intimal irregularity or stenosis. Right vertebral artery otherwise normal elsewhere within the neck. Skeleton: No worrisome osseous lesions. Moderate spondylosis at C6-7. Other neck: Irregular soft tissue density partially visualize within the subcutaneous fat of the right  upper back (series 8, image 125), nonspecific, but could reflect sequelae of prior trauma or less likely infection. No other acute abnormality. Upper chest: No other acute finding. Review of the MIP images confirms the above findings CTA HEAD FINDINGS Anterior circulation: Mild atheromatous change about the carotid siphons without hemodynamically significant stenosis. A1 segments patent bilaterally. Normal anterior communicating artery complex. Anterior cerebral arteries patent without significant stenosis. Focal moderate proximal right M1 stenosis (series 11, image 20). Left M1 patent without significant stenosis. No proximal MCA branch occlusion or high-grade stenosis. Distal MCA branches perfused and fairly symmetric. Posterior circulation: Both V4 segments patent without significant stenosis. Both PICA patent. Basilar patent without stenosis. Superior cerebellar arteries patent bilaterally. Both PCAs primarily supplied via the basilar. PCAs are patent to their distal aspects without significant stenosis. Venous sinuses: Patent allowing for timing the contrast bolus. Anatomic variants: As above.  No aneurysm. Review of the MIP images confirms the above findings IMPRESSION: CT HEAD: No acute intracranial abnormality. CTA HEAD AND NECK: 1. Apparent short-segment linear filling defect involving the distal right V2 segment at the level of the C3 transverse foramen as above. While this finding could be artifactual nature as there is some streak artifact through this region, a possible small short-segment dissection flap is difficult to exclude, and could be considered in the correct clinical setting. No significant intimal irregularity or stenosis. 2. Focal moderate proximal right M1 stenosis. 3. Mild for age atheromatous change about the carotid bifurcations and carotid siphons without hemodynamically significant stenosis. 4. Irregular soft tissue density within the subcutaneous fat of the right upper back,  nonspecific, but could reflect sequelae of prior trauma or less likely infection. Correlation with history and physical exam recommended. Electronically Signed   By: Morene Hoard M.D.   On: 04/21/2024 04:21   DG Chest 2 View Result Date: 04/20/2024 EXAM: 2 VIEW(S) XRAY OF THE CHEST 04/20/2024 10:09:27 PM COMPARISON: None available. CLINICAL HISTORY: The patient presents with chest pain. FINDINGS: LUNGS AND PLEURA: No focal pulmonary opacity. No pleural effusion. No pneumothorax. HEART AND MEDIASTINUM: No acute abnormality of the cardiac and mediastinal silhouettes. BONES AND SOFT TISSUES: No acute osseous abnormality. IMPRESSION: 1. No acute process. Electronically signed by: Oneil Devonshire MD 04/20/2024 10:11 PM EST RP Workstation: [id number]     PROCEDURES:  Critical Care performed: Yes, see critical care procedure note(s)   CRITICAL CARE Performed by: Josette Sink   Total critical care time: 30 minutes  Critical care time was exclusive of separately billable procedures and treating other patients.  Critical care was necessary to treat or prevent imminent or life-threatening deterioration.  Critical care was time spent personally by me  on the following activities: development of treatment plan with patient and/or surrogate as well as nursing, discussions with consultants, evaluation of patient's response to treatment, examination of patient, obtaining history from patient or surrogate, ordering and performing treatments and interventions, ordering and review of laboratory studies, ordering and review of radiographic studies, pulse oximetry and re-evaluation of patient's condition.   SABRA1-3 Lead EKG Interpretation  Performed by: Kihanna Kamiya, Josette SAILOR, DO Authorized by: Gracey Tolle, Josette SAILOR, DO     Interpretation: normal     ECG rate:  76   ECG rate assessment: normal     Rhythm: sinus rhythm     Ectopy: PVCs     Conduction: normal       IMPRESSION / MDM / ASSESSMENT AND PLAN / ED  COURSE  I reviewed the triage vital signs and the nursing notes.    Patient here with palpitations, head discomfort, extremely elevated blood pressure.  The patient is on the cardiac monitor to evaluate for evidence of arrhythmia and/or significant heart rate changes.   DIFFERENTIAL DIAGNOSIS (includes but not limited to):   Hypertensive urgency, hypertensive emergency, intracranial hemorrhage, ruptured aneurysm, carotid dissection, electrolyte derangement, thyroid dysfunction, ACS, less likely CHF.  Doubt PE aortic dissection.   Patient's presentation is most consistent with acute presentation with potential threat to life or bodily function.   PLAN: Will obtain labs, urine, chest x-ray, CT head and neck.  Will give medications to control his blood pressure.  Will give Tylenol  for headache.  Will place on cardiac monitoring.  EKG shows sinus rhythm without ischemic changes but does show occasional PVCs which may be the cause of the palpitations he is feeling.  Will check his electrolytes today.  Will check thyroid function.   MEDICATIONS GIVEN IN ED: Medications  hydrALAZINE  (APRESOLINE ) injection 10 mg (has no administration in time range)  potassium chloride  (KLOR-CON ) packet 40 mEq (40 mEq Oral Given 04/21/24 0140)  amLODipine  (NORVASC ) tablet 10 mg (10 mg Oral Given 04/21/24 0135)  acetaminophen  (TYLENOL ) tablet 1,000 mg (1,000 mg Oral Given 04/21/24 0236)  iohexol  (OMNIPAQUE ) 350 MG/ML injection 75 mL (75 mLs Intravenous Contrast Given 04/21/24 0301)  aspirin  chewable tablet 324 mg (324 mg Oral Given 04/21/24 0509)     ED COURSE: 2:31 AM  Patient's labs show potassium of 3.4.  Will give oral replacement.  Normal magnesium.  Normal TSH.  Normal hemoglobin.  First troponin slightly elevated at 20.  Second pending.  Chest x-ray reviewed and interpreted by myself and the radiologist and shows normal cardiac silhouette, no pulmonary edema.   3:42 AM Pt's repeat troponin downtrending.   Urine shows no proteinuria or hematuria.  Blood pressure slowly improving.   5:01 AM  Pt's CT scan reviewed and interpreted by myself and the radiologist and is concerning for vertebral artery dissection.  This would correlate with patient's symptoms today.  No focal neuro deficits.  Doubt CVA.  Will give full dose aspirin  and have recommended admission.  Patient agreeable to this plan.  Will discuss with the hospitalist.  5:30 AM  Pt's blood pressure now back in the 190s/90s.  Will give IV hydralazine .   CONSULTS:  Consulted and discussed patient's case with hospitalist, Dr. Lawence.  I have recommended admission and consulting physician agrees and will place admission orders.  Patient (and family if present) agree with this plan.   I reviewed all nursing notes, vitals, pertinent previous records.  All labs, EKGs, imaging ordered have been independently reviewed and interpreted by myself.  Tele neurology consulted per hospitalist request.  Please see their note for further details.  OUTSIDE RECORDS REVIEWED: Reviewed last family medicine notes for hypertension in 2019.       FINAL CLINICAL IMPRESSION(S) / ED DIAGNOSES   Final diagnoses:  Uncontrolled hypertension  Vertebral artery dissection     Rx / DC Orders   ED Discharge Orders          Ordered    Ambulatory Referral to Primary Care (Establish Care)        04/21/24 0232             Note:  This document was prepared using Dragon voice recognition software and may include unintentional dictation errors.   Demecia Northway, Josette SAILOR, DO 04/21/24 0503    Afrika Brick, Josette SAILOR, DO 04/21/24 0530  "

## 2024-04-21 NOTE — ED Notes (Signed)
 Tele Neurology Cart at bedside.

## 2024-04-21 NOTE — Progress Notes (Signed)
 STROKE TEAM PROGRESS NOTE   SUBJECTIVE (INTERVAL HISTORY) No family is at the bedside.  Overall his condition is gradually improving. HA at back of head had some improvement after tylenol . BP improving too. MRI negative for stroke.    OBJECTIVE Temp:  [97.5 F (36.4 C)-98 F (36.7 C)] 98 F (36.7 C) (01/14 0943) Pulse Rate:  [68-102] 82 (01/14 0943) Cardiac Rhythm: Normal sinus rhythm (01/14 0340) Resp:  [15-20] 16 (01/14 0943) BP: (158-227)/(94-126) 169/95 (01/14 0943) SpO2:  [94 %-98 %] 96 % (01/14 0943) Weight:  [85.7 kg] 85.7 kg (01/13 2157)  No results for input(s): GLUCAP in the last 168 hours. Recent Labs  Lab 04/20/24 2159 04/21/24 0848  NA 137  --   K 3.4*  --   CL 101  --   CO2 23  --   GLUCOSE 109*  --   BUN 19  --   CREATININE 1.05 0.87  CALCIUM 10.0  --   MG 2.2  --    No results for input(s): AST, ALT, ALKPHOS, BILITOT, PROT, ALBUMIN in the last 168 hours. Recent Labs  Lab 04/20/24 2159 04/21/24 0848  WBC 10.1 7.3  HGB 15.4 16.3  HCT 45.4 47.4  MCV 85.5 85.1  PLT 280 242   No results for input(s): CKTOTAL, CKMB, CKMBINDEX, TROPONINI in the last 168 hours. No results for input(s): LABPROT, INR in the last 72 hours. Recent Labs    04/21/24 0245  COLORURINE STRAW*  LABSPEC 1.004*  PHURINE 6.0  GLUCOSEU NEGATIVE  HGBUR NEGATIVE  BILIRUBINUR NEGATIVE  KETONESUR NEGATIVE  PROTEINUR NEGATIVE  NITRITE NEGATIVE  LEUKOCYTESUR NEGATIVE       Component Value Date/Time   CHOL 168 05/09/2017 1415   TRIG 171 (H) 05/09/2017 1415   HDL 39 (L) 05/09/2017 1415   LDLCALC 95 05/09/2017 1415   Lab Results  Component Value Date   HGBA1C 5.1 04/21/2024   No results found for: LABOPIA, COCAINSCRNUR, LABBENZ, AMPHETMU, THCU, LABBARB  No results for input(s): ETH in the last 168 hours.  I have personally reviewed the radiological images below and agree with the radiology interpretations.  MR BRAIN WO  CONTRAST Result Date: 04/21/2024 EXAM: MRI BRAIN WITHOUT CONTRAST 04/21/2024 12:07:32 PM TECHNIQUE: Multiplanar multisequence MRI of the head/brain was performed without the administration of intravenous contrast. COMPARISON: CTA head and neck earlier same day. CLINICAL HISTORY: Neuro deficit, acute, stroke suspected. Acute neurological deficit with suspected stroke. FINDINGS: BRAIN AND VENTRICLES: No acute infarct. No intracranial hemorrhage. No mass. No midline shift. No hydrocephalus. The sella is unremarkable. Normal flow voids. There are minimal scattered foci of T2 and FLAIR hyperintensity in the periventricular and subcortical white matter suggestive of chronic microvascular ischemic changes. ORBITS: No significant abnormality. SINUSES AND MASTOIDS: Mucosal thickening in the alveolar recess of the left maxillary sinus. No significant abnormality in the mastoids. BONES AND SOFT TISSUES: Normal marrow signal. No soft tissue abnormality. IMPRESSION: 1. No acute intracranial abnormality. 2. Minimal chronic microvascular ischemic white matter changes. Electronically signed by: Donnice Mania MD 04/21/2024 12:46 PM EST RP Workstation: [id number]   CT ANGIO HEAD NECK W WO CM Result Date: 04/21/2024 CLINICAL DATA:  Initial evaluation for acute headache, neck pain. EXAM: CT ANGIOGRAPHY HEAD AND NECK WITH AND WITHOUT CONTRAST TECHNIQUE: Multidetector CT imaging of the head and neck was performed using the standard protocol during bolus administration of intravenous contrast. Multiplanar CT image reconstructions and MIPs were obtained to evaluate the vascular anatomy. Carotid stenosis measurements (when applicable) are obtained  utilizing NASCET criteria, using the distal internal carotid diameter as the denominator. RADIATION DOSE REDUCTION: This exam was performed according to the departmental dose-optimization program which includes automated exposure control, adjustment of the mA and/or kV according to patient size  and/or use of iterative reconstruction technique. CONTRAST:  75mL OMNIPAQUE  IOHEXOL  350 MG/ML SOLN COMPARISON:  None available. FINDINGS: CT HEAD FINDINGS Brain: Cerebral volume within normal limits for patient age. No acute intracranial hemorrhage. No acute large vessel territory infarct. No mass lesion, midline shift, or mass effect. Ventricles are normal in size without hydrocephalus. No extra-axial fluid collection. Vascular: No abnormal hyperdense vessel. Skull: Scalp soft tissues demonstrate no acute abnormality. Calvarium intact. Sinuses/Orbits: Globes and orbital soft tissues within normal limits. Visualized paranasal sinuses are largely clear. No significant mastoid effusion. CTA NECK FINDINGS Aortic arch: Aortic arch within normal limits for caliber with standard branch pattern. No stenosis about the origin the great vessels. Right carotid system: Right common and internal carotid arteries are patent without dissection. Minimal for age plaque about the right carotid bulb without stenosis. Left carotid system: Left common and internal carotid arteries are patent without dissection. Mild for age atheromatous change about the left CCA and left carotid bulb without hemodynamically significant stenosis. Vertebral arteries: Both vertebral arteries arise from the subclavian arteries. No proximal subclavian artery stenosis. Right vertebral artery dominant. Left vertebral artery patent without stenosis or dissection. On the right, there is a small apparent linear filling defect involving the distal right V2 segment at the level of the C3 transverse foramen (series 8, images 103, 102). While this finding could be artifactual nature as there is some streak artifact through this region, a possible small short-segment dissection flap is difficult to exclude. No significant intimal irregularity or stenosis. Right vertebral artery otherwise normal elsewhere within the neck. Skeleton: No worrisome osseous lesions.  Moderate spondylosis at C6-7. Other neck: Irregular soft tissue density partially visualize within the subcutaneous fat of the right upper back (series 8, image 125), nonspecific, but could reflect sequelae of prior trauma or less likely infection. No other acute abnormality. Upper chest: No other acute finding. Review of the MIP images confirms the above findings CTA HEAD FINDINGS Anterior circulation: Mild atheromatous change about the carotid siphons without hemodynamically significant stenosis. A1 segments patent bilaterally. Normal anterior communicating artery complex. Anterior cerebral arteries patent without significant stenosis. Focal moderate proximal right M1 stenosis (series 11, image 20). Left M1 patent without significant stenosis. No proximal MCA branch occlusion or high-grade stenosis. Distal MCA branches perfused and fairly symmetric. Posterior circulation: Both V4 segments patent without significant stenosis. Both PICA patent. Basilar patent without stenosis. Superior cerebellar arteries patent bilaterally. Both PCAs primarily supplied via the basilar. PCAs are patent to their distal aspects without significant stenosis. Venous sinuses: Patent allowing for timing the contrast bolus. Anatomic variants: As above.  No aneurysm. Review of the MIP images confirms the above findings IMPRESSION: CT HEAD: No acute intracranial abnormality. CTA HEAD AND NECK: 1. Apparent short-segment linear filling defect involving the distal right V2 segment at the level of the C3 transverse foramen as above. While this finding could be artifactual nature as there is some streak artifact through this region, a possible small short-segment dissection flap is difficult to exclude, and could be considered in the correct clinical setting. No significant intimal irregularity or stenosis. 2. Focal moderate proximal right M1 stenosis. 3. Mild for age atheromatous change about the carotid bifurcations and carotid siphons without  hemodynamically significant stenosis. 4. Irregular  soft tissue density within the subcutaneous fat of the right upper back, nonspecific, but could reflect sequelae of prior trauma or less likely infection. Correlation with history and physical exam recommended. Electronically Signed   By: Morene Hoard M.D.   On: 04/21/2024 04:21   DG Chest 2 View Result Date: 04/20/2024 EXAM: 2 VIEW(S) XRAY OF THE CHEST 04/20/2024 10:09:27 PM COMPARISON: None available. CLINICAL HISTORY: The patient presents with chest pain. FINDINGS: LUNGS AND PLEURA: No focal pulmonary opacity. No pleural effusion. No pneumothorax. HEART AND MEDIASTINUM: No acute abnormality of the cardiac and mediastinal silhouettes. BONES AND SOFT TISSUES: No acute osseous abnormality. IMPRESSION: 1. No acute process. Electronically signed by: Oneil Devonshire MD 04/20/2024 10:11 PM EST RP Workstation: [id number]     PHYSICAL EXAM  Temp:  [97.5 F (36.4 C)-98 F (36.7 C)] 98 F (36.7 C) (01/14 0943) Pulse Rate:  [68-102] 82 (01/14 0943) Resp:  [15-20] 16 (01/14 0943) BP: (158-227)/(94-126) 169/95 (01/14 0943) SpO2:  [94 %-98 %] 96 % (01/14 0943) Weight:  [85.7 kg] 85.7 kg (01/13 2157)  General - Well nourished, well developed, in no apparent distress.  Ophthalmologic - fundi not visualized due to noncooperation.  Cardiovascular - Regular rhythm and rate.  Mental Status -  Level of arousal and orientation to time, place, and person were intact. Language including expression, naming, repetition, comprehension was assessed and found intact. Attention span and concentration were normal. Fund of Knowledge was assessed and was intact.  Cranial Nerves II - XII - II - Visual field intact OU. III, IV, VI - Extraocular movements intact. V - Facial sensation intact bilaterally. VII - Facial movement intact bilaterally. VIII - Hearing & vestibular intact bilaterally. X - Palate elevates symmetrically. XI - Chin turning & shoulder  shrug intact bilaterally. XII - Tongue protrusion intact.  Motor Strength - The patients strength was normal in all extremities and pronator drift was absent.  Bulk was normal and fasciculations were absent.   Motor Tone - Muscle tone was assessed at the neck and appendages and was normal.  Reflexes - The patients reflexes were symmetrical in all extremities and he had no pathological reflexes.  Sensory - Light touch, temperature/pinprick were assessed and were symmetrical.    Coordination - The patient had normal movements in the hands and feet with no ataxia or dysmetria.  Tremor was absent.  Gait and Station - deferred.   ASSESSMENT/PLAN Mr. Richard Bentley is a 64 y.o. male with history of HTN admitted for high blood pressure to 140s, felt sensation of skipped beats and headache at back of head.  CT no acute abnormality.  CT head and neck questionable right V2 filling defect, moderate right M1 stenosis.  On my review, the right V2 filling defect most likely artifact.  MRI no acute infarct.  2D echo result pending.  A1c 5.1.  LDL pending.  Currently on Norvasc  5 mg, BP now 170s to 180s.  Still has some mild headache at back of head.  Patient symptoms likely due to hypertensive urgency, recommend gradually normalize BP and follow-up with PCP.  Patient would like to have a PCP referral at this time.  Continue home aspirin  81.  If LDL more than 100, recommend statin treatment.    Hospital day # 1  Neurology will sign off. Please call with questions. Thanks for the consult.  Ary Cummins, MD PhD Stroke Neurology 04/21/2024 4:11 PM    To contact Stroke Continuity provider, please refer to [email or web address]. After hours, contact  General Neurology

## 2024-04-21 NOTE — H&P (Signed)
 "  History and Physical    Richard Bentley FMW:[id number] DOB: 1960-04-11 DOA: 04/21/2024  DOS: the patient was seen and examined on 04/21/2024  PCP: Patient, No Pcp Per   Patient coming from: Home  I have personally briefly reviewed patient's old medical records in [facility]  Chief Complaint: High blood pressure  HPI: Richard Bentley is a pleasant 65 y.o. male with medical history significant for hypertension not on any medication, anxiety who presented to ED at [facility] with complaints of headache and feeling pulsating pain in his back of the head, neck started last night.  Patient checked his blood pressure at home and it was significantly elevated.   he stated that sometimes his blood pressure goes up but gets better when he controls his diet including salt.  Patient stated that he has been having those blood pressure incidents since his late 64s.  And he has not taken medications except 1 time he was prescribed 1 medication but did not continue.  He only controls his diet and salt when his blood pressure goes up.  He denies any chest pain, visual problem, shortness of breath, any tingling numbness or weakness.  He denies any fever, chills, nausea, vomiting, or cough.  He denies any leg swelling.  ED Course: Upon arrival to the ED, patient is found to be hypertensive at 226/126, tachycardic at 102, afebrile 97.5.  Potassium was 3.4, troponin 20, EKG showed sinus rhythm with frequent premature complexes, CT of the head showed possible vertebral artery dissection.  Chest x-ray did not show any acute process.  Patient was given hydralazine , potassium, amlodipine  and aspirin  324 mg.  Teleneurology was consulted who advised that this could be hypertensive urgency with a tension type occipital cervical genic headache and palpitations but no neurologic deficits and normal CT head and showed no evidence of hypertensive emergency or acute stroke.  They advised for control of blood pressure,  antithrombotic medication aspirin  325 mg for long-term as patient was taking 81 at home, repeat CTA or MRA of the neck in 2 to 3 months to confirm or exclude vertebral dissection and to reassess the right M1 stenosis.  Hospitalist service was consulted for evaluation for admission for blood pressure control and overnight observation.  Review of Systems:  ROS  All other systems negative except as noted in the HPI.  Past Medical History:  Diagnosis Date   Anxiety    Gallbladder attack    Hypertension     Past Surgical History:  Procedure Laterality Date   CHOLECYSTECTOMY N/A 01/01/2018   Procedure: LAPAROSCOPIC CHOLECYSTECTOMY;  Surgeon: Jordis Laneta FALCON, MD;  Location: [facility] ORS;  Service: General;  Laterality: N/A;   HERNIA REPAIR       reports that he has quit smoking. He has quit using smokeless tobacco. He reports that he does not drink alcohol and does not use drugs.  Allergies[1]  Family History  Problem Relation Age of Onset   Hypertension Brother     Prior to Admission medications  Medication Sig Start Date End Date Taking? Authorizing Provider  aspirin  EC 81 MG tablet Take 81 mg by mouth daily. Swallow whole.   Yes [provider]  amLODipine  (NORVASC ) 5 MG tablet TAKE 1 TABLET(5 MG) BY MOUTH DAILY Patient not taking: Reported on 04/21/2024 05/04/18   Cannady, Jolene T, NP  citalopram  (CELEXA ) 20 MG tablet TAKE 1 TABLET(20 MG) BY MOUTH DAILY Patient not taking: Reported on 04/21/2024 01/06/18   Ashok Kathrine HERO, PA-C  Physical Exam: Vitals:   04/21/24 0437 04/21/24 0512 04/21/24 0532 04/21/24 0608  BP:  (!) 197/99 (!) 197/99 (!) 173/94  Pulse: 72 73  80  Resp: 20 19  15   Temp:    98 F (36.7 C)  TempSrc:    Oral  SpO2: 96% 94%  96%  Weight:      Height:        Physical Exam   Constitutional: Alert, awake, calm, comfortable HEENT: Neck supple Respiratory: Clear to auscultation B/L, no wheezing, no rales.  Cardiovascular: Regular rate and rhythm, no  murmurs / rubs / gallops. No extremity edema. 2+ pedal pulses. No carotid bruits.  Abdomen: Soft, no tenderness, Bowel sounds positive.  Musculoskeletal: no clubbing / cyanosis. Good ROM, no contractures. Normal muscle tone.  Skin: no rashes, lesions, ulcers. Neurologic: CN 2-12 grossly intact. Sensation intact, No focal deficit identified Psychiatric: Alert and oriented x 3. Normal mood.    Labs on Admission: I have personally reviewed following labs and imaging studies  CBC: Recent Labs  Lab 04/20/24 2159  WBC 10.1  HGB 15.4  HCT 45.4  MCV 85.5  PLT 280   Basic Metabolic Panel: Recent Labs  Lab 04/20/24 2159  NA 137  K 3.4*  CL 101  CO2 23  GLUCOSE 109*  BUN 19  CREATININE 1.05  CALCIUM 10.0  MG 2.2   GFR: Estimated Creatinine Clearance: 80.1 mL/min (by C-G formula based on SCr of 1.05 mg/dL). Liver Function Tests: No results for input(s): AST, ALT, ALKPHOS, BILITOT, PROT, ALBUMIN in the last 168 hours. No results for input(s): LIPASE, AMYLASE in the last 168 hours. No results for input(s): AMMONIA in the last 168 hours. Coagulation Profile: No results for input(s): INR, PROTIME in the last 168 hours. Cardiac Enzymes: No results for input(s): CKTOTAL, CKMB, CKMBINDEX, TROPONINI, TROPONINIHS in the last 168 hours. BNP (last 3 results) No results for input(s): BNP in the last 8760 hours. HbA1C: No results for input(s): HGBA1C in the last 72 hours. CBG: No results for input(s): GLUCAP in the last 168 hours. Lipid Profile: No results for input(s): CHOL, HDL, LDLCALC, TRIG, CHOLHDL, LDLDIRECT in the last 72 hours. Thyroid Function Tests: Recent Labs    04/20/24 2159  TSH 3.620   Anemia Panel: No results for input(s): VITAMINB12, FOLATE, FERRITIN, TIBC, IRON, RETICCTPCT in the last 72 hours. Urine analysis:    Component Value Date/Time   COLORURINE STRAW (A) 04/21/2024 0245   APPEARANCEUR CLEAR  (A) 04/21/2024 0245   APPEARANCEUR Clear 05/09/2017 1410   LABSPEC 1.004 (L) 04/21/2024 0245   LABSPEC 1.018 12/31/2011 2327   PHURINE 6.0 04/21/2024 0245   GLUCOSEU NEGATIVE 04/21/2024 0245   GLUCOSEU Negative 12/31/2011 2327   HGBUR NEGATIVE 04/21/2024 0245   BILIRUBINUR NEGATIVE 04/21/2024 0245   BILIRUBINUR Negative 05/09/2017 1410   BILIRUBINUR Negative 12/31/2011 2327   KETONESUR NEGATIVE 04/21/2024 0245   PROTEINUR NEGATIVE 04/21/2024 0245   NITRITE NEGATIVE 04/21/2024 0245   LEUKOCYTESUR NEGATIVE 04/21/2024 0245   LEUKOCYTESUR Negative 12/31/2011 2327    Radiological Exams on Admission: I have personally reviewed images CT ANGIO HEAD NECK W WO CM Result Date: 04/21/2024 CLINICAL DATA:  Initial evaluation for acute headache, neck pain. EXAM: CT ANGIOGRAPHY HEAD AND NECK WITH AND WITHOUT CONTRAST TECHNIQUE: Multidetector CT imaging of the head and neck was performed using the standard protocol during bolus administration of intravenous contrast. Multiplanar CT image reconstructions and MIPs were obtained to evaluate the vascular anatomy. Carotid stenosis measurements (when applicable)  are obtained utilizing NASCET criteria, using the distal internal carotid diameter as the denominator. RADIATION DOSE REDUCTION: This exam was performed according to the departmental dose-optimization program which includes automated exposure control, adjustment of the mA and/or kV according to patient size and/or use of iterative reconstruction technique. CONTRAST:  75mL OMNIPAQUE  IOHEXOL  350 MG/ML SOLN COMPARISON:  None available. FINDINGS: CT HEAD FINDINGS Brain: Cerebral volume within normal limits for patient age. No acute intracranial hemorrhage. No acute large vessel territory infarct. No mass lesion, midline shift, or mass effect. Ventricles are normal in size without hydrocephalus. No extra-axial fluid collection. Vascular: No abnormal hyperdense vessel. Skull: Scalp soft tissues demonstrate no acute  abnormality. Calvarium intact. Sinuses/Orbits: Globes and orbital soft tissues within normal limits. Visualized paranasal sinuses are largely clear. No significant mastoid effusion. CTA NECK FINDINGS Aortic arch: Aortic arch within normal limits for caliber with standard branch pattern. No stenosis about the origin the great vessels. Right carotid system: Right common and internal carotid arteries are patent without dissection. Minimal for age plaque about the right carotid bulb without stenosis. Left carotid system: Left common and internal carotid arteries are patent without dissection. Mild for age atheromatous change about the left CCA and left carotid bulb without hemodynamically significant stenosis. Vertebral arteries: Both vertebral arteries arise from the subclavian arteries. No proximal subclavian artery stenosis. Right vertebral artery dominant. Left vertebral artery patent without stenosis or dissection. On the right, there is a small apparent linear filling defect involving the distal right V2 segment at the level of the C3 transverse foramen (series 8, images 103, 102). While this finding could be artifactual nature as there is some streak artifact through this region, a possible small short-segment dissection flap is difficult to exclude. No significant intimal irregularity or stenosis. Right vertebral artery otherwise normal elsewhere within the neck. Skeleton: No worrisome osseous lesions. Moderate spondylosis at C6-7. Other neck: Irregular soft tissue density partially visualize within the subcutaneous fat of the right upper back (series 8, image 125), nonspecific, but could reflect sequelae of prior trauma or less likely infection. No other acute abnormality. Upper chest: No other acute finding. Review of the MIP images confirms the above findings CTA HEAD FINDINGS Anterior circulation: Mild atheromatous change about the carotid siphons without hemodynamically significant stenosis. A1 segments  patent bilaterally. Normal anterior communicating artery complex. Anterior cerebral arteries patent without significant stenosis. Focal moderate proximal right M1 stenosis (series 11, image 20). Left M1 patent without significant stenosis. No proximal MCA branch occlusion or high-grade stenosis. Distal MCA branches perfused and fairly symmetric. Posterior circulation: Both V4 segments patent without significant stenosis. Both PICA patent. Basilar patent without stenosis. Superior cerebellar arteries patent bilaterally. Both PCAs primarily supplied via the basilar. PCAs are patent to their distal aspects without significant stenosis. Venous sinuses: Patent allowing for timing the contrast bolus. Anatomic variants: As above.  No aneurysm. Review of the MIP images confirms the above findings IMPRESSION: CT HEAD: No acute intracranial abnormality. CTA HEAD AND NECK: 1. Apparent short-segment linear filling defect involving the distal right V2 segment at the level of the C3 transverse foramen as above. While this finding could be artifactual nature as there is some streak artifact through this region, a possible small short-segment dissection flap is difficult to exclude, and could be considered in the correct clinical setting. No significant intimal irregularity or stenosis. 2. Focal moderate proximal right M1 stenosis. 3. Mild for age atheromatous change about the carotid bifurcations and carotid siphons without hemodynamically significant stenosis. 4.  Irregular soft tissue density within the subcutaneous fat of the right upper back, nonspecific, but could reflect sequelae of prior trauma or less likely infection. Correlation with history and physical exam recommended. Electronically Signed   By: Morene Hoard M.D.   On: 04/21/2024 04:21   DG Chest 2 View Result Date: 04/20/2024 EXAM: 2 VIEW(S) XRAY OF THE CHEST 04/20/2024 10:09:27 PM COMPARISON: None available. CLINICAL HISTORY: The patient presents with  chest pain. FINDINGS: LUNGS AND PLEURA: No focal pulmonary opacity. No pleural effusion. No pneumothorax. HEART AND MEDIASTINUM: No acute abnormality of the cardiac and mediastinal silhouettes. BONES AND SOFT TISSUES: No acute osseous abnormality. IMPRESSION: 1. No acute process. Electronically signed by: Oneil Devonshire MD 04/20/2024 10:11 PM EST RP Workstation: [id number]    EKG: My personal interpretation of EKG shows: Normal sinus rhythm with premature ventricular complexes and inferior old infarct    Assessment/Plan Principal Problem:   Hypertensive urgency    Assessment and Plan:  64 year old male with history of chronic hypertension not on any medication who came into ED complaining of headache and elevated blood pressure.  1.  Hypertensive urgency - He will be placed in observation. - His CT scan of the brain did not show any acute pathology. - CT angio of head and neck showed possible vertebral artery dissection versus artifact. - Neurology evaluated the patient and advised for control of blood pressure and follow-up as outpatient. - Patient will be given aspirin  324 mg daily moving forward for neurologist recommendation - Patient does not have any neurological deficit. - Patient was started on amlodipine  10 mg and hydralazine  as needed.  Will continue to monitor blood pressure and blood pressure goal would be less than 140. - Extensive counseling was done regarding patient blood pressure control. - I will place him on TIA protocol  2.  Hypokalemia - Potassium was given. - Will check potassium level.  3.  Anxiety - Extensive counseling was done - Patient stated that he used to take escitalopram but he has not taken for some time. - Will continue these medications while he is in the hospital.    DVT prophylaxis: Lovenox  Code Status: Full Code Family Communication: None Disposition Plan: Home Consults called: Teleneurology Admission status: Observation,  progressive   Nena Rebel, MD [facility] 04/21/2024, 8:57 AM       [1] No Known Allergies  "

## 2024-04-21 NOTE — ED Notes (Signed)
 [facility] called at 3:30am

## 2024-04-21 NOTE — Progress Notes (Signed)
 SLP Cancellation Note  Patient Details Name: Richard Bentley MRN: [id number] DOB: 1960-08-31   Cancelled treatment:       Reason Eval/Treat Not Completed: SLP screened, no needs identified, will sign off (chart reviewed; met w/ pt in hallway during test.)   Pt is a 64 y.o. male with history of hypertension not on medications, anxiety who presents to the emergency department complaints of headache and feeling a pulsating pain in the back of his head and neck  He checked his BP at home, which was elevated.   Pt denied any difficulty swallowing and is currently on a regular diet; tolerates swallowing pills w/ water per NSG. He stated he swallowed fine during his breakfast meal this morning. Pt conversed in full conversation w/out expressive/receptive deficits noted; pt denied any speech-language deficits. Speech clear. No further skilled ST services indicated as pt appears at his baseline. Pt agreed. NSG to reconsult if any change in status while admitted.       [name], MS, CCC-SLP Speech Language Pathologist Rehab Services; [facility] [phone] (ascom) [name] 04/21/2024, 1:20 PM

## 2024-04-22 DIAGNOSIS — I160 Hypertensive urgency: Secondary | ICD-10-CM | POA: Diagnosis not present

## 2024-04-22 LAB — ECHOCARDIOGRAM COMPLETE
AR max vel: 2.53 cm2
AV Area VTI: 3.15 cm2
AV Area mean vel: 2.34 cm2
AV Mean grad: 2 mmHg
AV Peak grad: 3.9 mmHg
Ao pk vel: 0.98 m/s
Area-P 1/2: 5.66 cm2
MV VTI: 2.94 cm2
S' Lateral: 3.7 cm

## 2024-04-22 LAB — LIPID PANEL
Cholesterol: 180 mg/dL (ref 0–200)
HDL: 37 mg/dL — ABNORMAL LOW
LDL Cholesterol: 115 mg/dL — ABNORMAL HIGH (ref 0–99)
Total CHOL/HDL Ratio: 4.9 ratio
Triglycerides: 140 mg/dL
VLDL: 28 mg/dL (ref 0–40)

## 2024-04-22 MED ORDER — BUPROPION HCL ER (XL) 150 MG PO TB24
150.0000 mg | ORAL_TABLET | Freq: Every day | ORAL | Status: DC
  Administered 2024-04-22: 150 mg via ORAL
  Filled 2024-04-22: qty 1

## 2024-04-22 MED ORDER — LISINOPRIL 10 MG PO TABS
10.0000 mg | ORAL_TABLET | Freq: Every day | ORAL | Status: DC
  Administered 2024-04-22 – 2024-04-23 (×2): 10 mg via ORAL
  Filled 2024-04-22 (×2): qty 1

## 2024-04-22 NOTE — Discharge Instructions (Signed)
 Some PCP options in [place] area- not a comprehensive list  [facility]- [phone] [facility]- [phone] [facility]- [phone] [facility]- [phone] [facility]- [phone] Nichole Molly- [phone]  or [facility] Physician Referral Line [phone]

## 2024-04-22 NOTE — Progress Notes (Signed)
 " Progress Note   Patient: Richard Bentley FMW:[id number] DOB: 10/10/1960 DOA: 04/21/2024     1 DOS: the patient was seen and examined on 04/22/2024   Brief hospital course: ROLDAN LAFOREST is a pleasant 64 y.o. male with medical history significant for hypertension not on any medication, anxiety who presented to ED at [facility] with complaints of headache and feeling pulsating pain in his back of the head, neck started last night.  Patient checked his blood pressure at home and it was significantly elevated.   he stated that sometimes his blood pressure goes up but gets better when he controls his diet including salt.  Patient stated that he has been having those blood pressure incidents since his late 61s.  And he has not taken medications except 1 time he was prescribed 1 medication but did not continue.  He only controls his diet and salt when his blood pressure goes up.  He denies any chest pain, visual problem, shortness of breath, any tingling numbness or weakness.  He denies any fever, chills, nausea, vomiting, or cough.  He denies any leg swelling.   ED Course: Upon arrival to the ED, patient is found to be hypertensive at 226/126, tachycardic at 102, afebrile 97.5.  Potassium was 3.4, troponin 20, EKG showed sinus rhythm with frequent premature complexes, CT of the head showed possible vertebral artery dissection.  Chest x-ray did not show any acute process.  Patient was given hydralazine , potassium, amlodipine  and aspirin  324 mg.  Teleneurology was consulted who advised that this could be hypertensive urgency with a tension type occipital cervical genic headache and palpitations but no neurologic deficits and normal CT head and showed no evidence of hypertensive emergency or acute stroke.  They advised for control of blood pressure, antithrombotic medication aspirin  325 mg for long-term as patient was taking 81 at home, repeat CTA or MRA of the neck in 2 to 3 months to confirm or exclude vertebral  dissection and to reassess the right M1 stenosis.  Hospitalist service was consulted for evaluation for admission for blood pressure control and overnight observation.     Assessment and Plan:   1.  Hypertensive urgency Concern for possible vertebral artery dissection Patient presented to the emergency room for evaluation of severe headache with pulsation in the back of his head and neck. Blood pressure on admission was 226/126 Patient had CT angiogram of the head and neck which showed apparent short-segment linear filling defect involving the distal right V2 segment at the level of the C3 transverse foramen as above.While this finding could be artifactual nature as there is some streak artifact through this region, a possible small short-segment dissection flap is difficult to exclude, and could be considered in the correct clinical setting. No significant intimal irregularity or stenosis. Focal moderate proximal right M1 stenosis. Appreciate neurology input MRI of the brain showed no acute intracranial abnormality. Minimal chronic microvascular ischemic white matter changes. Improved blood pressure control.  Patient was started on amlodipine  10 mg daily.  I have added lisinopril  10 mg daily as well.  Will uptitrate to optimize blood pressure control Continue aspirin  81 mg daily    2.  Hypokalemia Potassium was supplemented   3.  Anxiety - Extensive counseling was done Continue Celexa           Subjective: Continues to complain of a headache but improved. Rated 2/10 in intensity at its worst  Physical Exam: Vitals:   04/22/24 0800 04/22/24 0907 04/22/24 1210 04/22/24 1249  BP: (!) 195/105 (!) 152/96 (!) 154/98 (!) 142/89  Pulse: 90 79 73 78  Resp:  18  18  Temp: 97.6 F (36.4 C) 98.1 F (36.7 C) 97.8 F (36.6 C) 98.2 F (36.8 C)  TempSrc: Oral  Oral   SpO2: 98% 97% 97% 97%  Weight:      Height:       Constitutional: Alert, awake, calm, comfortable HEENT: Neck  supple Respiratory: Clear to auscultation B/L, no wheezing, no rales.  Cardiovascular: Regular rate and rhythm, no murmurs / rubs / gallops. No extremity edema. 2+ pedal pulses. No carotid bruits.  Abdomen: Soft, no tenderness, Bowel sounds positive.  Musculoskeletal: no clubbing / cyanosis. Good ROM, no contractures. Normal muscle tone.  Skin: no rashes, lesions, ulcers. Neurologic: CN 2-12 grossly intact. Sensation intact, No focal deficit identified Psychiatric: Alert and oriented x 3. Normal mood.     Data Reviewed:  There are no new results to review at this time.  Family Communication: Plan of care was discussed with patient at the bedside. He verbalizes understanding and agrees with the plan  Disposition: Status is: Observation The patient remains OBS appropriate and will d/c before 2 midnights.  Planned Discharge Destination: Home    Time spent: 50 minutes  Author: Aimee Somerset, MD 04/22/2024 1:36 PM  For on call review [email or web address].  "

## 2024-04-23 ENCOUNTER — Other Ambulatory Visit: Payer: Self-pay

## 2024-04-23 DIAGNOSIS — E876 Hypokalemia: Secondary | ICD-10-CM | POA: Diagnosis present

## 2024-04-23 DIAGNOSIS — I160 Hypertensive urgency: Secondary | ICD-10-CM | POA: Diagnosis not present

## 2024-04-23 MED ORDER — CITALOPRAM HYDROBROMIDE 20 MG PO TABS
20.0000 mg | ORAL_TABLET | Freq: Every day | ORAL | 0 refills | Status: AC
  Filled 2024-04-23: qty 30, 30d supply, fill #0

## 2024-04-23 MED ORDER — LISINOPRIL 10 MG PO TABS
10.0000 mg | ORAL_TABLET | Freq: Every day | ORAL | 1 refills | Status: DC
  Filled 2024-04-23: qty 30, 30d supply, fill #0

## 2024-04-23 MED ORDER — PRAVASTATIN SODIUM 20 MG PO TABS: 20.0000 mg | ORAL_TABLET | Freq: Every day | ORAL | 0 refills | Status: AC

## 2024-04-23 MED ORDER — AMLODIPINE BESYLATE 10 MG PO TABS
10.0000 mg | ORAL_TABLET | Freq: Every day | ORAL | 1 refills | Status: AC
  Filled 2024-04-23: qty 30, 30d supply, fill #0

## 2024-04-23 NOTE — Plan of Care (Signed)

## 2024-04-23 NOTE — Plan of Care (Signed)
  Problem: Education: Goal: Knowledge of disease or condition will improve Outcome: Progressing Goal: Knowledge of patient specific risk factors will improve (DELETE if not current risk factor) Outcome: Progressing   Problem: Ischemic Stroke/TIA Tissue Perfusion: Goal: Complications of ischemic stroke/TIA will be minimized Outcome: Progressing

## 2024-04-23 NOTE — Discharge Summary (Addendum)
 " Physician Discharge Summary   Patient: Richard Bentley MRN: [id number] DOB: 04/08/1961  Admit date:     04/21/2024  Discharge date: 04/23/24  Discharge Physician: Addilyn Satterwhite   PCP: Patient, No Pcp Per   Recommendations at discharge:   Take antihypertensive medications as recommended Follow-up with PCP as an outpatient Return to ER for evaluation of worsening symptoms  Discharge Diagnoses: Principal Problem:   Hypertensive urgency Active Problems:   Anxiety   Hypokalemia  Resolved Problems:   * No resolved hospital problems. *  Hospital Course: Richard Bentley is a pleasant 64 y.o. male with medical history significant for hypertension not on any medication, anxiety who presented to ED at [facility] with complaints of headache and feeling pulsating pain in his back of the head, neck started last night.  Patient checked his blood pressure at home and it was significantly elevated.   he stated that sometimes his blood pressure goes up but gets better when he controls his diet including salt.  Patient stated that he has been having those blood pressure incidents since his late 5s.  And he has not taken medications except 1 time he was prescribed 1 medication but did not continue.  He only controls his diet and salt when his blood pressure goes up.  He denies any chest pain, visual problem, shortness of breath, any tingling numbness or weakness.  He denies any fever, chills, nausea, vomiting, or cough.  He denies any leg swelling.   ED Course: Upon arrival to the ED, patient is found to be hypertensive at 226/126, tachycardic at 102, afebrile 97.5.  Potassium was 3.4, troponin 20, EKG showed sinus rhythm with frequent premature complexes, CT of the head showed possible vertebral artery dissection.  Chest x-ray did not show any acute process.  Patient was given hydralazine , potassium, amlodipine  and aspirin  324 mg.  Teleneurology was consulted who advised that this could be hypertensive  urgency with a tension type occipital cervical genic headache and palpitations but no neurologic deficits and normal CT head and showed no evidence of hypertensive emergency or acute stroke.  They advised for control of blood pressure, antithrombotic medication aspirin  325 mg for long-term as patient was taking 81 at home, repeat CTA or MRA of the neck in 2 to 3 months to confirm or exclude vertebral dissection and to reassess the right M1 stenosis.  Hospitalist service was consulted for evaluation for admission for blood pressure control and overnight observation.     Assessment and Plan:   1.  Hypertensive urgency Concern for possible vertebral artery dissection Patient presented to the emergency room for evaluation of severe headache with pulsation in the back of his head and neck. Blood pressure on admission was 226/126 Patient had CT angiogram of the head and neck which showed apparent short-segment linear filling defect involving the distal right V2 segment at the level of the C3 transverse foramen as above.While this finding could be artifactual nature as there is some streak artifact through this region, a possible small short-segment dissection flap is difficult to exclude, and could be considered in the correct clinical setting. No significant intimal irregularity or stenosis. Focal moderate proximal right M1 stenosis. Appreciate neurology input.  Recommends to optimize blood pressure control and start patient on a statin if LDL greater than 110 MRI of the brain showed no acute intracranial abnormality. Minimal chronic microvascular ischemic white matter changes. Improved blood pressure control on amlodipine  10 mg daily and lisinopril  10 mg daily. Continue aspirin   81 mg daily Will send patient home on pravastatin  20 mg daily     2.  Hypokalemia Potassium was supplemented   3.  Anxiety Continue Celexa          Consultants: Neurology Procedures performed: None Disposition:  Home Diet recommendation:  Discharge Diet Orders (From admission, onward)     Start     Ordered   04/23/24 0000  Diet - low sodium heart healthy        04/23/24 0858           Cardiac diet DISCHARGE MEDICATION: Allergies as of 04/23/2024   No Known Allergies      Medication List     TAKE these medications    amLODipine  10 MG tablet Commonly known as: NORVASC  Take 1 tablet (10 mg total) by mouth daily. Start taking on: April 24, 2024 What changed:  medication strength See the new instructions.   aspirin  EC 81 MG tablet Take 81 mg by mouth daily. Swallow whole.   citalopram  20 MG tablet Commonly known as: CELEXA  Take 1 tablet (20 mg total) by mouth daily. Start taking on: April 24, 2024 What changed: See the new instructions.   lisinopril  10 MG tablet Commonly known as: ZESTRIL  Take 1 tablet (10 mg total) by mouth daily. Start taking on: April 24, 2024   pravastatin  20 MG tablet Commonly known as: PRAVACHOL  Take 1 tablet (20 mg total) by mouth daily.        Discharge Exam: Filed Weights   04/20/24 2157  Weight: 85.7 kg   Constitutional: Alert, awake, calm, comfortable HEENT: Neck supple Respiratory: Clear to auscultation B/L, no wheezing, no rales.  Cardiovascular: Regular rate and rhythm, no murmurs / rubs / gallops. No extremity edema. 2+ pedal pulses. No carotid bruits.  Abdomen: Soft, no tenderness, Bowel sounds positive.  Musculoskeletal: no clubbing / cyanosis. Good ROM, no contractures. Normal muscle tone.  Skin: no rashes, lesions, ulcers. Neurologic: CN 2-12 grossly intact. Sensation intact, No focal deficit identified Psychiatric: Alert and oriented x 3. Normal mood.    Condition at discharge: stable  The results of significant diagnostics from this hospitalization (including imaging, microbiology, ancillary and laboratory) are listed below for reference.   Imaging Studies: ECHOCARDIOGRAM COMPLETE Result Date: 04/22/2024     ECHOCARDIOGRAM REPORT   Patient Name:   Richard Bentley [facility] Date of Exam: 04/21/2024 Medical Rec #:  [id number]         Height:       72.0 in Accession #:    [id number]        Weight:       189.0 lb Date of Birth:  [birth year]         BSA:          2.080 m Patient Age:    63 years          BP:           169/95 mmHg Patient Gender: M                 HR:           82 bpm. Exam Location:  [facility] Procedure: 3D Echo, 2D Echo, Cardiac Doppler, Color Doppler, Saline Contrast            Bubble Study and Strain Analysis (Both Spectral and Color Flow            Doppler were utilized during procedure). Indications:     TIA G45.9  History:  Patient has no prior history of Echocardiogram examinations.                  Risk Factors:Hypertension. Anxiety.  Sonographer:     Christopher Furnace Referring Phys:  [id number] [facility] PAUDEL Diagnosing Phys: Cara JONETTA Lovelace MD  Sonographer Comments: Global longitudinal strain was attempted. IMPRESSIONS  1. Left ventricular ejection fraction, by estimation, is 35 to 40%. The left ventricle has moderately decreased function. The left ventricle demonstrates regional wall motion abnormalities (see scoring diagram/findings for description). The left ventricular internal cavity size was mildly dilated. There is moderate asymmetric left ventricular hypertrophy of the septal segment. Left ventricular diastolic parameters are consistent with Grade I diastolic dysfunction (impaired relaxation). The average left ventricular global longitudinal strain is 8.5 %. The global longitudinal strain is abnormal.  2. Right ventricular systolic function is normal. The right ventricular size is normal.  3. The mitral valve is normal in structure. Mild mitral valve regurgitation.  4. The aortic valve is grossly normal. Aortic valve regurgitation is not visualized. Aortic valve sclerosis/calcification is present, without any evidence of aortic stenosis. FINDINGS  Left Ventricle: Left ventricular ejection fraction, by  estimation, is 35 to 40%. The left ventricle has moderately decreased function. The left ventricle demonstrates regional wall motion abnormalities. The average left ventricular global longitudinal strain is 8.5 %. Strain was performed and the global longitudinal strain is abnormal. The left ventricular internal cavity size was mildly dilated. There is moderate asymmetric left ventricular hypertrophy of the septal segment. Left ventricular diastolic parameters are consistent with Grade I diastolic dysfunction (impaired relaxation). Right Ventricle: The right ventricular size is normal. No increase in right ventricular wall thickness. Right ventricular systolic function is normal. Left Atrium: Left atrial size was normal in size. Right Atrium: Right atrial size was normal in size. Pericardium: There is no evidence of pericardial effusion. Mitral Valve: The mitral valve is normal in structure. There is moderate thickening of the mitral valve leaflet(s). There is moderate calcification of the mitral valve leaflet(s). Normal mobility of the mitral valve leaflets. Mild mitral valve regurgitation. MV peak gradient, 5.3 mmHg. The mean mitral valve gradient is 2.0 mmHg. Tricuspid Valve: The tricuspid valve is normal in structure. Tricuspid valve regurgitation is mild. Aortic Valve: The aortic valve is grossly normal. Aortic valve regurgitation is not visualized. Aortic valve sclerosis/calcification is present, without any evidence of aortic stenosis. Aortic valve mean gradient measures 2.0 mmHg. Aortic valve peak gradient measures 3.9 mmHg. Aortic valve area, by VTI measures 3.15 cm. Pulmonic Valve: The pulmonic valve was not well visualized. Pulmonic valve regurgitation is not visualized. Aorta: The aortic root was not well visualized. IAS/Shunts: No atrial level shunt detected by color flow Doppler. Agitated saline contrast was given intravenously to evaluate for intracardiac shunting. Additional Comments: 3D was  performed not requiring image post processing on an independent workstation and was indeterminate.  LEFT VENTRICLE PLAX 2D LVIDd:         5.00 cm   Diastology LVIDs:         3.70 cm   LV e' medial:    6.31 cm/s LV PW:         1.00 cm   LV E/e' medial:  8.9 LV IVS:        1.50 cm   LV e' lateral:   10.00 cm/s LVOT diam:     2.10 cm   LV E/e' lateral: 5.6 LV SV:         53 LV SV  Index:   25        2D Longitudinal Strain LVOT Area:     3.46 cm  2D Strain GLS (A4C):   12.5 %                          2D Strain GLS (A3C):   5.9 %                          2D Strain GLS (A2C):   7.1 %                          2D Strain GLS Avg:     8.5 % RIGHT VENTRICLE RV Basal diam:  1.30 cm RV Mid diam:    1.20 cm LEFT ATRIUM             Index        RIGHT ATRIUM          Index LA diam:        3.60 cm 1.73 cm/m   RA Area:     6.45 cm LA Vol (A2C):   24.0 ml 11.54 ml/m  RA Volume:   7.51 ml  3.61 ml/m LA Vol (A4C):   27.1 ml 13.03 ml/m LA Biplane Vol: 25.3 ml 12.16 ml/m  AORTIC VALVE AV Area (Vmax):    2.53 cm AV Area (Vmean):   2.34 cm AV Area (VTI):     3.15 cm AV Vmax:           98.30 cm/s AV Vmean:          70.900 cm/s AV VTI:            0.168 m AV Peak Grad:      3.9 mmHg AV Mean Grad:      2.0 mmHg LVOT Vmax:         71.90 cm/s LVOT Vmean:        47.900 cm/s LVOT VTI:          0.153 m LVOT/AV VTI ratio: 0.91  AORTA Ao Root diam: 2.70 cm MITRAL VALVE                TRICUSPID VALVE MV Area (PHT): 5.66 cm     TR Peak grad:   6.6 mmHg MV Area VTI:   2.94 cm     TR Vmax:        128.00 cm/s MV Peak grad:  5.3 mmHg MV Mean grad:  2.0 mmHg     SHUNTS MV Vmax:       1.15 m/s     Systemic VTI:  0.15 m MV Vmean:      67.8 cm/s    Systemic Diam: 2.10 cm MV Decel Time: 134 msec MV E velocity: 56.10 cm/s MV A velocity: 111.00 cm/s MV E/A ratio:  0.51 Dwayne D Callwood MD Electronically signed by Cara JONETTA Lovelace MD Signature Date/Time: 04/22/2024/5:38:56 PM    Final    MR BRAIN WO CONTRAST Result Date: 04/21/2024 EXAM: MRI BRAIN  WITHOUT CONTRAST 04/21/2024 12:07:32 PM TECHNIQUE: Multiplanar multisequence MRI of the head/brain was performed without the administration of intravenous contrast. COMPARISON: CTA head and neck earlier same day. CLINICAL HISTORY: Neuro deficit, acute, stroke suspected. Acute neurological deficit with suspected stroke. FINDINGS: BRAIN AND VENTRICLES: No acute infarct. No intracranial hemorrhage. No mass. No midline shift. No hydrocephalus. The sella is unremarkable.  Normal flow voids. There are minimal scattered foci of T2 and FLAIR hyperintensity in the periventricular and subcortical white matter suggestive of chronic microvascular ischemic changes. ORBITS: No significant abnormality. SINUSES AND MASTOIDS: Mucosal thickening in the alveolar recess of the left maxillary sinus. No significant abnormality in the mastoids. BONES AND SOFT TISSUES: Normal marrow signal. No soft tissue abnormality. IMPRESSION: 1. No acute intracranial abnormality. 2. Minimal chronic microvascular ischemic white matter changes. Electronically signed by: Donnice Mania MD 04/21/2024 12:46 PM EST RP Workstation: [id number]   CT ANGIO HEAD NECK W WO CM Result Date: 04/21/2024 CLINICAL DATA:  Initial evaluation for acute headache, neck pain. EXAM: CT ANGIOGRAPHY HEAD AND NECK WITH AND WITHOUT CONTRAST TECHNIQUE: Multidetector CT imaging of the head and neck was performed using the standard protocol during bolus administration of intravenous contrast. Multiplanar CT image reconstructions and MIPs were obtained to evaluate the vascular anatomy. Carotid stenosis measurements (when applicable) are obtained utilizing NASCET criteria, using the distal internal carotid diameter as the denominator. RADIATION DOSE REDUCTION: This exam was performed according to the departmental dose-optimization program which includes automated exposure control, adjustment of the mA and/or kV according to patient size and/or use of iterative reconstruction technique.  CONTRAST:  75mL OMNIPAQUE  IOHEXOL  350 MG/ML SOLN COMPARISON:  None available. FINDINGS: CT HEAD FINDINGS Brain: Cerebral volume within normal limits for patient age. No acute intracranial hemorrhage. No acute large vessel territory infarct. No mass lesion, midline shift, or mass effect. Ventricles are normal in size without hydrocephalus. No extra-axial fluid collection. Vascular: No abnormal hyperdense vessel. Skull: Scalp soft tissues demonstrate no acute abnormality. Calvarium intact. Sinuses/Orbits: Globes and orbital soft tissues within normal limits. Visualized paranasal sinuses are largely clear. No significant mastoid effusion. CTA NECK FINDINGS Aortic arch: Aortic arch within normal limits for caliber with standard branch pattern. No stenosis about the origin the great vessels. Right carotid system: Right common and internal carotid arteries are patent without dissection. Minimal for age plaque about the right carotid bulb without stenosis. Left carotid system: Left common and internal carotid arteries are patent without dissection. Mild for age atheromatous change about the left CCA and left carotid bulb without hemodynamically significant stenosis. Vertebral arteries: Both vertebral arteries arise from the subclavian arteries. No proximal subclavian artery stenosis. Right vertebral artery dominant. Left vertebral artery patent without stenosis or dissection. On the right, there is a small apparent linear filling defect involving the distal right V2 segment at the level of the C3 transverse foramen (series 8, images 103, 102). While this finding could be artifactual nature as there is some streak artifact through this region, a possible small short-segment dissection flap is difficult to exclude. No significant intimal irregularity or stenosis. Right vertebral artery otherwise normal elsewhere within the neck. Skeleton: No worrisome osseous lesions. Moderate spondylosis at C6-7. Other neck: Irregular soft  tissue density partially visualize within the subcutaneous fat of the right upper back (series 8, image 125), nonspecific, but could reflect sequelae of prior trauma or less likely infection. No other acute abnormality. Upper chest: No other acute finding. Review of the MIP images confirms the above findings CTA HEAD FINDINGS Anterior circulation: Mild atheromatous change about the carotid siphons without hemodynamically significant stenosis. A1 segments patent bilaterally. Normal anterior communicating artery complex. Anterior cerebral arteries patent without significant stenosis. Focal moderate proximal right M1 stenosis (series 11, image 20). Left M1 patent without significant stenosis. No proximal MCA branch occlusion or high-grade stenosis. Distal MCA branches perfused and fairly symmetric. Posterior circulation: Both  V4 segments patent without significant stenosis. Both PICA patent. Basilar patent without stenosis. Superior cerebellar arteries patent bilaterally. Both PCAs primarily supplied via the basilar. PCAs are patent to their distal aspects without significant stenosis. Venous sinuses: Patent allowing for timing the contrast bolus. Anatomic variants: As above.  No aneurysm. Review of the MIP images confirms the above findings IMPRESSION: CT HEAD: No acute intracranial abnormality. CTA HEAD AND NECK: 1. Apparent short-segment linear filling defect involving the distal right V2 segment at the level of the C3 transverse foramen as above. While this finding could be artifactual nature as there is some streak artifact through this region, a possible small short-segment dissection flap is difficult to exclude, and could be considered in the correct clinical setting. No significant intimal irregularity or stenosis. 2. Focal moderate proximal right M1 stenosis. 3. Mild for age atheromatous change about the carotid bifurcations and carotid siphons without hemodynamically significant stenosis. 4. Irregular soft  tissue density within the subcutaneous fat of the right upper back, nonspecific, but could reflect sequelae of prior trauma or less likely infection. Correlation with history and physical exam recommended. Electronically Signed   By: Morene Hoard M.D.   On: 04/21/2024 04:21   DG Chest 2 View Result Date: 04/20/2024 EXAM: 2 VIEW(S) XRAY OF THE CHEST 04/20/2024 10:09:27 PM COMPARISON: None available. CLINICAL HISTORY: The patient presents with chest pain. FINDINGS: LUNGS AND PLEURA: No focal pulmonary opacity. No pleural effusion. No pneumothorax. HEART AND MEDIASTINUM: No acute abnormality of the cardiac and mediastinal silhouettes. BONES AND SOFT TISSUES: No acute osseous abnormality. IMPRESSION: 1. No acute process. Electronically signed by: Oneil Devonshire MD 04/20/2024 10:11 PM EST RP Workstation: [id number]    Microbiology: Results for orders placed or performed during the hospital encounter of 01/01/18  MRSA PCR Screening     Status: None   Collection Time: 01/01/18  3:04 PM   Specimen: Nasopharyngeal  Result Value Ref Range Status   MRSA by PCR NEGATIVE NEGATIVE Final    Comment:        The GeneXpert MRSA Assay (FDA approved for NASAL specimens only), is one component of a comprehensive MRSA colonization surveillance program. It is not intended to diagnose MRSA infection nor to guide or monitor treatment for MRSA infections. Performed at [facility], [address] [place], [place] [address]     Labs: CBC: Recent Labs  Lab 04/20/24 2159 04/21/24 0848  WBC 10.1 7.3  HGB 15.4 16.3  HCT 45.4 47.4  MCV 85.5 85.1  PLT 280 242   Basic Metabolic Panel: Recent Labs  Lab 04/20/24 2159 04/21/24 0848  NA 137  --   K 3.4*  --   CL 101  --   CO2 23  --   GLUCOSE 109*  --   BUN 19  --   CREATININE 1.05 0.87  CALCIUM 10.0  --   MG 2.2  --    Liver Function Tests: No results for input(s): AST, ALT, ALKPHOS, BILITOT, PROT, ALBUMIN in the last  168 hours. CBG: No results for input(s): GLUCAP in the last 168 hours.  Discharge time spent: greater than 30 minutes.  Signed: Aimee Somerset, MD [facility] 04/23/2024 "

## 2024-04-25 ENCOUNTER — Telehealth: Admitting: Family

## 2024-04-25 VITALS — BP 167/81 | HR 79

## 2024-04-25 DIAGNOSIS — Z09 Encounter for follow-up examination after completed treatment for conditions other than malignant neoplasm: Secondary | ICD-10-CM

## 2024-04-25 DIAGNOSIS — I10 Essential (primary) hypertension: Secondary | ICD-10-CM | POA: Diagnosis not present

## 2024-04-25 DIAGNOSIS — I7774 Dissection of vertebral artery: Secondary | ICD-10-CM | POA: Diagnosis not present

## 2024-04-25 MED ORDER — LISINOPRIL 20 MG PO TABS: 20.0000 mg | ORAL_TABLET | Freq: Every day | ORAL | 3 refills | Status: AC

## 2024-04-25 NOTE — Patient Instructions (Signed)
 Hypertension, Adult High blood pressure (hypertension) is when the force of blood pumping through the arteries is too strong. The arteries are the blood vessels that carry blood from the heart throughout the body. Hypertension forces the heart to work harder to pump blood and may cause arteries to become narrow or stiff. Untreated or uncontrolled hypertension can lead to a heart attack, heart failure, a stroke, kidney disease, and other problems. A blood pressure reading consists of a higher number over a lower number. Ideally, your blood pressure should be below 120/80. The first ("top") number is called the systolic pressure. It is a measure of the pressure in your arteries as your heart beats. The second ("bottom") number is called the diastolic pressure. It is a measure of the pressure in your arteries as the heart relaxes. What are the causes? The exact cause of this condition is not known. There are some conditions that result in high blood pressure. What increases the risk? Certain factors may make you more likely to develop high blood pressure. Some of these risk factors are under your control, including: Smoking. Not getting enough exercise or physical activity. Being overweight. Having too much fat, sugar, calories, or salt (sodium) in your diet. Drinking too much alcohol. Other risk factors include: Having a personal history of heart disease, diabetes, high cholesterol, or kidney disease. Stress. Having a family history of high blood pressure and high cholesterol. Having obstructive sleep apnea. Age. The risk increases with age. What are the signs or symptoms? High blood pressure may not cause symptoms. Very high blood pressure (hypertensive crisis) may cause: Headache. Fast or irregular heartbeats (palpitations). Shortness of breath. Nosebleed. Nausea and vomiting. Vision changes. Severe chest pain, dizziness, and seizures. How is this diagnosed? This condition is diagnosed by  measuring your blood pressure while you are seated, with your arm resting on a flat surface, your legs uncrossed, and your feet flat on the floor. The cuff of the blood pressure monitor will be placed directly against the skin of your upper arm at the level of your heart. Blood pressure should be measured at least twice using the same arm. Certain conditions can cause a difference in blood pressure between your right and left arms. If you have a high blood pressure reading during one visit or you have normal blood pressure with other risk factors, you may be asked to: Return on a different day to have your blood pressure checked again. Monitor your blood pressure at home for 1 week or longer. If you are diagnosed with hypertension, you may have other blood or imaging tests to help your health care provider understand your overall risk for other conditions. How is this treated? This condition is treated by making healthy lifestyle changes, such as eating healthy foods, exercising more, and reducing your alcohol intake. You may be referred for counseling on a healthy diet and physical activity. Your health care provider may prescribe medicine if lifestyle changes are not enough to get your blood pressure under control and if: Your systolic blood pressure is above 130. Your diastolic blood pressure is above 80. Your personal target blood pressure may vary depending on your medical conditions, your age, and other factors. Follow these instructions at home: Eating and drinking  Eat a diet that is high in fiber and potassium, and low in sodium, added sugar, and fat. An example of this eating plan is called the DASH diet. DASH stands for Dietary Approaches to Stop Hypertension. To eat this way: Eat  plenty of fresh fruits and vegetables. Try to fill one half of your plate at each meal with fruits and vegetables. Eat whole grains, such as whole-wheat pasta, brown rice, or whole-grain bread. Fill about one  fourth of your plate with whole grains. Eat or drink low-fat dairy products, such as skim milk or low-fat yogurt. Avoid fatty cuts of meat, processed or cured meats, and poultry with skin. Fill about one fourth of your plate with lean proteins, such as fish, chicken without skin, beans, eggs, or tofu. Avoid pre-made and processed foods. These tend to be higher in sodium, added sugar, and fat. Reduce your daily sodium intake. Many people with hypertension should eat less than 1,500 mg of sodium a day. Do not drink alcohol if: Your health care provider tells you not to drink. You are pregnant, may be pregnant, or are planning to become pregnant. If you drink alcohol: Limit how much you have to: 0-1 drink a day for women. 0-2 drinks a day for men. Know how much alcohol is in your drink. In the [place] one drink equals one 12 oz bottle of beer (355 mL), one 5 oz glass of wine (148 mL), or one 1 oz glass of hard liquor (44 mL). Lifestyle  Work with your health care provider to maintain a healthy body weight or to lose weight. Ask what an ideal weight is for you. Get at least 30 minutes of exercise that causes your heart to beat faster (aerobic exercise) most days of the week. Activities may include walking, swimming, or biking. Include exercise to strengthen your muscles (resistance exercise), such as Pilates or lifting weights, as part of your weekly exercise routine. Try to do these types of exercises for 30 minutes at least 3 days a week. Do not use any products that contain nicotine or tobacco. These products include cigarettes, chewing tobacco, and vaping devices, such as e-cigarettes. If you need help quitting, ask your health care provider. Monitor your blood pressure at home as told by your health care provider. Keep all follow-up visits. This is important. Medicines Take over-the-counter and prescription medicines only as told by your health care provider. Follow directions carefully. Blood  pressure medicines must be taken as prescribed. Do not skip doses of blood pressure medicine. Doing this puts you at risk for problems and can make the medicine less effective. Ask your health care provider about side effects or reactions to medicines that you should watch for. Contact a health care provider if you: Think you are having a reaction to a medicine you are taking. Have headaches that keep coming back (recurring). Feel dizzy. Have swelling in your ankles. Have trouble with your vision. Get help right away if you: Develop a severe headache or confusion. Have unusual weakness or numbness. Feel faint. Have severe pain in your chest or abdomen. Vomit repeatedly. Have trouble breathing. These symptoms may be an emergency. Get help right away. Call 911. Do not wait to see if the symptoms will go away. Do not drive yourself to the hospital. Summary Hypertension is when the force of blood pumping through your arteries is too strong. If this condition is not controlled, it may put you at risk for serious complications. Your personal target blood pressure may vary depending on your medical conditions, your age, and other factors. For most people, a normal blood pressure is less than 120/80. Hypertension is treated with lifestyle changes, medicines, or a combination of both. Lifestyle changes include losing weight, eating a healthy,  low-sodium diet, exercising more, and limiting alcohol. This information is not intended to replace advice given to you by your health care provider. Make sure you discuss any questions you have with your health care provider. Document Revised: 01/30/2021 Document Reviewed: 01/30/2021 Elsevier Patient Education  2024 [employer].

## 2024-04-25 NOTE — Progress Notes (Signed)
 " Virtual Visit Consent   Richard Bentley, you are scheduled for a virtual visit with a [facility] provider today. Just as with appointments in the office, your consent must be obtained to participate. Your consent will be active for this visit and any virtual visit you may have with one of our providers in the next 365 days. If you have a MyChart account, a copy of this consent can be sent to you electronically.  As this is a virtual visit, video technology does not allow for your provider to perform a traditional examination. This may limit your provider's ability to fully assess your condition. If your provider identifies any concerns that need to be evaluated in person or the need to arrange testing (such as labs, EKG, etc.), we will make arrangements to do so. Although advances in technology are sophisticated, we cannot ensure that it will always work on either your end or our end. If the connection with a video visit is poor, the visit may have to be switched to a telephone visit. With either a video or telephone visit, we are not always able to ensure that we have a secure connection.  By engaging in this virtual visit, you consent to the provision of healthcare and authorize for your insurance to be billed (if applicable) for the services provided during this visit. Depending on your insurance coverage, you may receive a charge related to this service.  I need to obtain your verbal consent now. Are you willing to proceed with your visit today? Richard Bentley has provided verbal consent on 04/25/2024 for a virtual visit (video or telephone). Bari Learn, FNP  Date: 04/25/2024 4:21 PM   Virtual Visit via Video Note   I, Bari Learn, connected with  Richard Bentley  ([id number], 1960-06-12) on 04/25/24 at  4:15 PM EST by a video-enabled telemedicine application and verified that I am speaking with the correct person using two identifiers.  Location: Patient: Virtual Visit Location  Patient: Home Provider: Virtual Visit Location Provider: Home Office   I discussed the limitations of evaluation and management by telemedicine and the availability of in person appointments. The patient expressed understanding and agreed to proceed.    History of Present Illness: Richard Bentley is a 64 y.o. who identifies as a male who was assigned male at birth, and is being seen today for elevated blood pressure. He went to the ED for hypertensive urgency on 04/21/24 of 226/126.  Patient is found to be hypertensive at 226/126, tachycardic at 102, afebrile 97.5. Potassium was 3.4, troponin 20, EKG showed sinus rhythm with frequent premature complexes, CT of the head showed possible vertebral artery dissection. Chest x-ray did not show any acute process. Patient was given hydralazine , potassium, amlodipine  and aspirin  324 mg. Teleneurology was consulted who advised that this could be hypertensive urgency with a tension type occipital cervical genic headache and palpitations but no neurologic deficits and normal CT head and showed no evidence of hypertensive emergency or acute stroke. They advised for control of blood pressure, antithrombotic medication aspirin  325 mg for long-term as patient was taking 81 at home, repeat CTA or MRA of the neck in 2 to 3 months to confirm or exclude vertebral dissection and to reassess the right M1 stenosis.  He was discharged on lisinopril  10 mg, Norvasc  10 mg, pravastatin  20 mg, and aspirin .  Reports his BP today have 167/81 and HR 79.   HPI: Hypertension This is a chronic problem. The current episode  started more than 1 year ago. The problem is unchanged. The problem is uncontrolled. Associated symptoms include malaise/fatigue. Pertinent negatives include no chest pain, headaches, peripheral edema or shortness of breath. Risk factors for coronary artery disease include male gender. Past treatments include ACE inhibitors and calcium channel blockers. The current  treatment provides mild improvement.    Problems:  Patient Active Problem List   Diagnosis Date Noted   Hypokalemia 04/23/2024   Hypertensive urgency 04/21/2024   Cholecystitis 01/01/2018   Anxiety 05/09/2017   Essential hypertension, benign 05/09/2017    Allergies: Allergies[1] Medications: Current Medications[2]  Observations/Objective: Patient is well-developed, well-nourished in no acute distress.  Resting comfortably  at home.  Head is normocephalic, atraumatic.  No labored breathing. Speech is clear and coherent with logical content.  Patient is alert and oriented at baseline.    Assessment and Plan: 1. Essential hypertension, benign (Primary) - lisinopril  (ZESTRIL ) 20 MG tablet; Take 1 tablet (20 mg total) by mouth daily.  Dispense: 90 tablet; Refill: 3  2. Hospital discharge follow-up - lisinopril  (ZESTRIL ) 20 MG tablet; Take 1 tablet (20 mg total) by mouth daily.  Dispense: 90 tablet; Refill: 3  3. Vertebral artery dissection  Will increase Lisinopril  to 20 mg from 10 mg Continue Norvasc  10 mg -Dash diet information given -Exercise encouraged - Stress Management  -Continue current meds -Pt will call tomorrow to establish care with a new PCP Will need repeat BMP and repeat CTA or MRA of the neck in 2 to 3 months to confirm or exclude vertebral dissection and to reassess the right M1 stenosis   Follow Up Instructions: I discussed the assessment and treatment plan with the patient. The patient was provided an opportunity to ask questions and all were answered. The patient agreed with the plan and demonstrated an understanding of the instructions.  A copy of instructions were sent to the patient via MyChart unless otherwise noted below.     The patient was advised to call back or seek an in-person evaluation if the symptoms worsen or if the condition fails to improve as anticipated.    Bari Learn, FNP    [1] No Known Allergies [2]  Current Outpatient  Medications:    lisinopril  (ZESTRIL ) 20 MG tablet, Take 1 tablet (20 mg total) by mouth daily., Disp: 90 tablet, Rfl: 3   amLODipine  (NORVASC ) 10 MG tablet, Take 1 tablet (10 mg total) by mouth daily., Disp: 30 tablet, Rfl: 1   aspirin  EC 81 MG tablet, Take 81 mg by mouth daily. Swallow whole., Disp: , Rfl:    citalopram  (CELEXA ) 20 MG tablet, Take 1 tablet (20 mg total) by mouth daily., Disp: 30 tablet, Rfl: 0   pravastatin  (PRAVACHOL ) 20 MG tablet, Take 1 tablet (20 mg total) by mouth daily., Disp: 30 tablet, Rfl: 0  "

## 2024-05-04 ENCOUNTER — Ambulatory Visit

## 2024-05-20 ENCOUNTER — Ambulatory Visit

## 2024-05-21 ENCOUNTER — Ambulatory Visit: Admitting: Internal Medicine

## 2024-06-04 ENCOUNTER — Ambulatory Visit: Admitting: Internal Medicine
